# Patient Record
Sex: Female | Born: 1960 | Hispanic: No | Marital: Single | Smoking: Never smoker
Health system: Southern US, Community
[De-identification: ages and names within clinical notes are randomized; demographics above are authoritative.]

## PROBLEM LIST (undated history)

## (undated) DIAGNOSIS — Z780 Asymptomatic menopausal state: Secondary | ICD-10-CM

## (undated) DIAGNOSIS — Z808 Family history of malignant neoplasm of other organs or systems: Secondary | ICD-10-CM

## (undated) DIAGNOSIS — Z800 Family history of malignant neoplasm of digestive organs: Secondary | ICD-10-CM

## (undated) DIAGNOSIS — Z8042 Family history of malignant neoplasm of prostate: Secondary | ICD-10-CM

## (undated) DIAGNOSIS — Z853 Personal history of malignant neoplasm of breast: Secondary | ICD-10-CM

## (undated) DIAGNOSIS — C801 Malignant (primary) neoplasm, unspecified: Secondary | ICD-10-CM

## (undated) DIAGNOSIS — C50919 Malignant neoplasm of unspecified site of unspecified female breast: Principal | ICD-10-CM

## (undated) DIAGNOSIS — Z803 Family history of malignant neoplasm of breast: Secondary | ICD-10-CM

## (undated) DIAGNOSIS — R5383 Other fatigue: Secondary | ICD-10-CM

## (undated) HISTORY — PX: BREAST SURGERY: SHX581

## (undated) HISTORY — DX: Other fatigue: R53.83

## (undated) HISTORY — DX: Personal history of malignant neoplasm of breast: Z85.3

## (undated) HISTORY — DX: Malignant neoplasm of unspecified site of unspecified female breast: C50.919

## (undated) HISTORY — DX: Malignant (primary) neoplasm, unspecified: C80.1

## (undated) HISTORY — DX: Family history of malignant neoplasm of other organs or systems: Z80.8

## (undated) HISTORY — DX: Asymptomatic menopausal state: Z78.0

## (undated) HISTORY — DX: Family history of malignant neoplasm of breast: Z80.3

## (undated) HISTORY — DX: Family history of malignant neoplasm of prostate: Z80.42

## (undated) HISTORY — DX: Family history of malignant neoplasm of digestive organs: Z80.0

---

## 1997-05-15 ENCOUNTER — Ambulatory Visit (HOSPITAL_COMMUNITY): Admission: RE | Admit: 1997-05-15 | Discharge: 1997-05-15 | Payer: Self-pay | Admitting: Obstetrics and Gynecology

## 1998-04-16 ENCOUNTER — Other Ambulatory Visit: Admission: RE | Admit: 1998-04-16 | Discharge: 1998-04-16 | Payer: Self-pay | Admitting: Obstetrics and Gynecology

## 1999-05-24 ENCOUNTER — Other Ambulatory Visit: Admission: RE | Admit: 1999-05-24 | Discharge: 1999-05-24 | Payer: Self-pay | Admitting: *Deleted

## 2001-06-15 ENCOUNTER — Encounter: Payer: Self-pay | Admitting: Unknown Physician Specialty

## 2001-06-15 ENCOUNTER — Ambulatory Visit (HOSPITAL_COMMUNITY): Admission: RE | Admit: 2001-06-15 | Discharge: 2001-06-15 | Payer: Self-pay | Admitting: Unknown Physician Specialty

## 2002-03-21 HISTORY — PX: BREAST BIOPSY: SHX20

## 2002-07-03 ENCOUNTER — Ambulatory Visit (HOSPITAL_COMMUNITY): Admission: RE | Admit: 2002-07-03 | Discharge: 2002-07-03 | Payer: Self-pay | Admitting: Unknown Physician Specialty

## 2002-07-03 ENCOUNTER — Encounter: Payer: Self-pay | Admitting: Unknown Physician Specialty

## 2002-07-10 ENCOUNTER — Ambulatory Visit (HOSPITAL_COMMUNITY): Admission: RE | Admit: 2002-07-10 | Discharge: 2002-07-10 | Payer: Self-pay | Admitting: Unknown Physician Specialty

## 2002-07-10 ENCOUNTER — Encounter: Payer: Self-pay | Admitting: Unknown Physician Specialty

## 2005-03-21 HISTORY — PX: MASTECTOMY: SHX3

## 2006-06-01 ENCOUNTER — Ambulatory Visit (HOSPITAL_COMMUNITY): Admission: RE | Admit: 2006-06-01 | Discharge: 2006-06-01 | Payer: Self-pay | Admitting: Family Medicine

## 2007-03-22 DIAGNOSIS — C50919 Malignant neoplasm of unspecified site of unspecified female breast: Secondary | ICD-10-CM

## 2007-03-22 HISTORY — DX: Malignant neoplasm of unspecified site of unspecified female breast: C50.919

## 2008-02-25 ENCOUNTER — Ambulatory Visit: Payer: Self-pay | Admitting: Genetic Counselor

## 2008-02-29 ENCOUNTER — Encounter: Admission: RE | Admit: 2008-02-29 | Discharge: 2008-02-29 | Payer: Self-pay | Admitting: General Surgery

## 2008-04-09 ENCOUNTER — Ambulatory Visit (HOSPITAL_COMMUNITY): Admission: RE | Admit: 2008-04-09 | Discharge: 2008-04-09 | Payer: Self-pay | Admitting: General Surgery

## 2008-04-09 ENCOUNTER — Encounter (INDEPENDENT_AMBULATORY_CARE_PROVIDER_SITE_OTHER): Payer: Self-pay | Admitting: General Surgery

## 2008-04-09 ENCOUNTER — Encounter: Admission: RE | Admit: 2008-04-09 | Discharge: 2008-04-09 | Payer: Self-pay | Admitting: General Surgery

## 2008-05-06 ENCOUNTER — Ambulatory Visit: Admission: RE | Admit: 2008-05-06 | Discharge: 2008-05-19 | Payer: Self-pay | Admitting: Radiation Oncology

## 2008-05-07 ENCOUNTER — Ambulatory Visit: Payer: Self-pay | Admitting: Oncology

## 2008-05-07 LAB — CBC WITH DIFFERENTIAL/PLATELET
BASO%: 0.3 % (ref 0.0–2.0)
Basophils Absolute: 0 10*3/uL (ref 0.0–0.1)
EOS%: 0.5 % (ref 0.0–7.0)
MCH: 31.8 pg (ref 26.0–34.0)
MCHC: 34.7 g/dL (ref 32.0–36.0)
MCV: 91.4 fL (ref 81.0–101.0)
MONO%: 7.1 % (ref 0.0–13.0)
RBC: 4.04 10*6/uL (ref 3.70–5.32)
RDW: 12.6 % (ref 11.3–14.5)
lymph#: 2.1 10*3/uL (ref 0.9–3.3)

## 2008-05-08 LAB — LACTATE DEHYDROGENASE: LDH: 137 U/L (ref 94–250)

## 2008-05-08 LAB — COMPREHENSIVE METABOLIC PANEL
ALT: 14 U/L (ref 0–35)
BUN: 14 mg/dL (ref 6–23)
CO2: 26 mEq/L (ref 19–32)
Calcium: 9.6 mg/dL (ref 8.4–10.5)
Chloride: 105 mEq/L (ref 96–112)
Creatinine, Ser: 1.01 mg/dL (ref 0.40–1.20)
Glucose, Bld: 74 mg/dL (ref 70–99)

## 2008-05-08 LAB — CANCER ANTIGEN 27.29: CA 27.29: 25 U/mL (ref 0–39)

## 2008-05-28 ENCOUNTER — Encounter: Payer: Self-pay | Admitting: Oncology

## 2008-05-28 ENCOUNTER — Ambulatory Visit: Admission: RE | Admit: 2008-05-28 | Discharge: 2008-05-28 | Payer: Self-pay | Admitting: Oncology

## 2008-05-28 ENCOUNTER — Ambulatory Visit: Payer: Self-pay | Admitting: Cardiology

## 2008-06-12 ENCOUNTER — Inpatient Hospital Stay (HOSPITAL_COMMUNITY): Admission: RE | Admit: 2008-06-12 | Discharge: 2008-06-15 | Payer: Self-pay | Admitting: General Surgery

## 2008-06-12 ENCOUNTER — Encounter (INDEPENDENT_AMBULATORY_CARE_PROVIDER_SITE_OTHER): Payer: Self-pay | Admitting: General Surgery

## 2008-07-02 ENCOUNTER — Ambulatory Visit: Payer: Self-pay | Admitting: Oncology

## 2008-07-18 LAB — CBC WITH DIFFERENTIAL/PLATELET
BASO%: 0.1 % (ref 0.0–2.0)
Basophils Absolute: 0 10*3/uL (ref 0.0–0.1)
HCT: 33.8 % — ABNORMAL LOW (ref 34.8–46.6)
HGB: 11.6 g/dL (ref 11.6–15.9)
LYMPH%: 11.6 % — ABNORMAL LOW (ref 14.0–49.7)
MCH: 31.1 pg (ref 25.1–34.0)
MCHC: 34.3 g/dL (ref 31.5–36.0)
MONO#: 0.2 10*3/uL (ref 0.1–0.9)
NEUT%: 87.3 % — ABNORMAL HIGH (ref 38.4–76.8)
Platelets: 310 10*3/uL (ref 145–400)
WBC: 28.7 10*3/uL — ABNORMAL HIGH (ref 3.9–10.3)
lymph#: 3.3 10*3/uL (ref 0.9–3.3)

## 2008-07-18 LAB — BASIC METABOLIC PANEL
BUN: 20 mg/dL (ref 6–23)
CO2: 23 mEq/L (ref 19–32)
Calcium: 8.7 mg/dL (ref 8.4–10.5)
Chloride: 103 mEq/L (ref 96–112)
Creatinine, Ser: 1.03 mg/dL (ref 0.40–1.20)
Glucose, Bld: 169 mg/dL — ABNORMAL HIGH (ref 70–99)

## 2008-07-25 LAB — CBC WITH DIFFERENTIAL/PLATELET
Basophils Absolute: 0 10*3/uL (ref 0.0–0.1)
Eosinophils Absolute: 0 10*3/uL (ref 0.0–0.5)
HCT: 29.8 % — ABNORMAL LOW (ref 34.8–46.6)
LYMPH%: 21.6 % (ref 14.0–49.7)
MCV: 87.1 fL (ref 79.5–101.0)
MONO#: 0.5 10*3/uL (ref 0.1–0.9)
MONO%: 4.5 % (ref 0.0–14.0)
NEUT#: 7.3 10*3/uL — ABNORMAL HIGH (ref 1.5–6.5)
NEUT%: 73.4 % (ref 38.4–76.8)
Platelets: 227 10*3/uL (ref 145–400)
WBC: 10 10*3/uL (ref 3.9–10.3)
nRBC: 0 % (ref 0–0)

## 2008-08-01 LAB — CBC WITH DIFFERENTIAL/PLATELET
Basophils Absolute: 0 10*3/uL (ref 0.0–0.1)
EOS%: 0 % (ref 0.0–7.0)
HCT: 30.5 % — ABNORMAL LOW (ref 34.8–46.6)
HGB: 10.5 g/dL — ABNORMAL LOW (ref 11.6–15.9)
LYMPH%: 8.3 % — ABNORMAL LOW (ref 14.0–49.7)
MCH: 30.4 pg (ref 25.1–34.0)
MCHC: 34.4 g/dL (ref 31.5–36.0)
MCV: 88.4 fL (ref 79.5–101.0)
NEUT%: 90.6 % — ABNORMAL HIGH (ref 38.4–76.8)
Platelets: 465 10*3/uL — ABNORMAL HIGH (ref 145–400)
lymph#: 1.1 10*3/uL (ref 0.9–3.3)

## 2008-08-01 LAB — COMPREHENSIVE METABOLIC PANEL
Albumin: 4.2 g/dL (ref 3.5–5.2)
CO2: 19 mEq/L (ref 19–32)
Calcium: 9.9 mg/dL (ref 8.4–10.5)
Chloride: 105 mEq/L (ref 96–112)
Glucose, Bld: 202 mg/dL — ABNORMAL HIGH (ref 70–99)
Potassium: 3.9 mEq/L (ref 3.5–5.3)
Sodium: 139 mEq/L (ref 135–145)
Total Bilirubin: 0.6 mg/dL (ref 0.3–1.2)
Total Protein: 6.8 g/dL (ref 6.0–8.3)

## 2008-08-08 LAB — CBC WITH DIFFERENTIAL/PLATELET
BASO%: 0.2 % (ref 0.0–2.0)
Eosinophils Absolute: 0 10*3/uL (ref 0.0–0.5)
MCHC: 34.5 g/dL (ref 31.5–36.0)
MONO#: 1.4 10*3/uL — ABNORMAL HIGH (ref 0.1–0.9)
NEUT#: 9.6 10*3/uL — ABNORMAL HIGH (ref 1.5–6.5)
RBC: 3.54 10*6/uL — ABNORMAL LOW (ref 3.70–5.45)
RDW: 12.8 % (ref 11.2–14.5)
WBC: 13.6 10*3/uL — ABNORMAL HIGH (ref 3.9–10.3)
lymph#: 2.6 10*3/uL (ref 0.9–3.3)

## 2008-08-13 ENCOUNTER — Ambulatory Visit: Payer: Self-pay | Admitting: Oncology

## 2008-08-15 LAB — CBC WITH DIFFERENTIAL/PLATELET
BASO%: 0.2 % (ref 0.0–2.0)
Eosinophils Absolute: 0 10*3/uL (ref 0.0–0.5)
HCT: 29.8 % — ABNORMAL LOW (ref 34.8–46.6)
LYMPH%: 24 % (ref 14.0–49.7)
MONO#: 0.5 10*3/uL (ref 0.1–0.9)
NEUT#: 6.6 10*3/uL — ABNORMAL HIGH (ref 1.5–6.5)
NEUT%: 70.5 % (ref 38.4–76.8)
Platelets: 149 10*3/uL (ref 145–400)
WBC: 9.4 10*3/uL (ref 3.9–10.3)
lymph#: 2.3 10*3/uL (ref 0.9–3.3)

## 2008-08-22 ENCOUNTER — Ambulatory Visit: Payer: Self-pay | Admitting: Oncology

## 2008-08-22 LAB — COMPREHENSIVE METABOLIC PANEL
ALT: 29 U/L (ref 0–35)
AST: 17 U/L (ref 0–37)
Creatinine, Ser: 0.89 mg/dL (ref 0.40–1.20)
Sodium: 137 mEq/L (ref 135–145)
Total Bilirubin: 0.4 mg/dL (ref 0.3–1.2)
Total Protein: 6.9 g/dL (ref 6.0–8.3)

## 2008-08-22 LAB — CBC WITH DIFFERENTIAL/PLATELET
BASO%: 0.1 % (ref 0.0–2.0)
LYMPH%: 8 % — ABNORMAL LOW (ref 14.0–49.7)
MCHC: 34.8 g/dL (ref 31.5–36.0)
MONO#: 0.2 10*3/uL (ref 0.1–0.9)
MONO%: 1.5 % (ref 0.0–14.0)
Platelets: 270 10*3/uL (ref 145–400)
RBC: 3.46 10*6/uL — ABNORMAL LOW (ref 3.70–5.45)
RDW: 14 % (ref 11.2–14.5)
WBC: 13.7 10*3/uL — ABNORMAL HIGH (ref 3.9–10.3)
nRBC: 0 % (ref 0–0)

## 2008-08-29 LAB — CBC WITH DIFFERENTIAL/PLATELET
BASO%: 0.2 % (ref 0.0–2.0)
EOS%: 0 % (ref 0.0–7.0)
MCH: 30.4 pg (ref 25.1–34.0)
MCHC: 34.4 g/dL (ref 31.5–36.0)
MCV: 88.5 fL (ref 79.5–101.0)
MONO%: 13.3 % (ref 0.0–14.0)
RBC: 3.22 10*6/uL — ABNORMAL LOW (ref 3.70–5.45)
RDW: 13.5 % (ref 11.2–14.5)
lymph#: 2.1 10*3/uL (ref 0.9–3.3)

## 2008-09-05 LAB — CBC WITH DIFFERENTIAL/PLATELET
Basophils Absolute: 0 10*3/uL (ref 0.0–0.1)
EOS%: 0 % (ref 0.0–7.0)
Eosinophils Absolute: 0 10*3/uL (ref 0.0–0.5)
HGB: 10.3 g/dL — ABNORMAL LOW (ref 11.6–15.9)
MONO%: 6.4 % (ref 0.0–14.0)
NEUT#: 6.6 10*3/uL — ABNORMAL HIGH (ref 1.5–6.5)
RBC: 3.34 10*6/uL — ABNORMAL LOW (ref 3.70–5.45)
RDW: 14.7 % — ABNORMAL HIGH (ref 11.2–14.5)
lymph#: 2.5 10*3/uL (ref 0.9–3.3)

## 2008-09-12 LAB — CBC WITH DIFFERENTIAL/PLATELET
Basophils Absolute: 0 10*3/uL (ref 0.0–0.1)
Eosinophils Absolute: 0 10*3/uL (ref 0.0–0.5)
HGB: 10.5 g/dL — ABNORMAL LOW (ref 11.6–15.9)
MCV: 88.9 fL (ref 79.5–101.0)
MONO#: 0.1 10*3/uL (ref 0.1–0.9)
MONO%: 0.7 % (ref 0.0–14.0)
NEUT#: 11 10*3/uL — ABNORMAL HIGH (ref 1.5–6.5)
Platelets: 219 10*3/uL (ref 145–400)
RBC: 3.41 10*6/uL — ABNORMAL LOW (ref 3.70–5.45)
RDW: 14.9 % — ABNORMAL HIGH (ref 11.2–14.5)
WBC: 12.3 10*3/uL — ABNORMAL HIGH (ref 3.9–10.3)

## 2008-09-18 LAB — CBC WITH DIFFERENTIAL/PLATELET
BASO%: 0.4 % (ref 0.0–2.0)
EOS%: 0.2 % (ref 0.0–7.0)
MCH: 30.4 pg (ref 25.1–34.0)
MCHC: 34.2 g/dL (ref 31.5–36.0)
RDW: 14.4 % (ref 11.2–14.5)
WBC: 4.9 10*3/uL (ref 3.9–10.3)
lymph#: 1.9 10*3/uL (ref 0.9–3.3)

## 2008-09-19 ENCOUNTER — Ambulatory Visit: Payer: Self-pay | Admitting: Oncology

## 2008-09-26 LAB — COMPREHENSIVE METABOLIC PANEL
ALT: 98 U/L — ABNORMAL HIGH (ref 0–35)
Alkaline Phosphatase: 90 U/L (ref 39–117)
Creatinine, Ser: 0.93 mg/dL (ref 0.40–1.20)
Sodium: 139 mEq/L (ref 135–145)
Total Bilirubin: 0.6 mg/dL (ref 0.3–1.2)
Total Protein: 5.6 g/dL — ABNORMAL LOW (ref 6.0–8.3)

## 2008-09-26 LAB — CBC WITH DIFFERENTIAL/PLATELET
BASO%: 0.1 % (ref 0.0–2.0)
HGB: 9.7 g/dL — ABNORMAL LOW (ref 11.6–15.9)
LYMPH%: 28.6 % (ref 14.0–49.7)
MCH: 30.6 pg (ref 25.1–34.0)
MCHC: 33.8 g/dL (ref 31.5–36.0)
MCV: 90.5 fL (ref 79.5–101.0)
MONO%: 7.8 % (ref 0.0–14.0)
NEUT#: 5.3 10*3/uL (ref 1.5–6.5)
NEUT%: 63.5 % (ref 38.4–76.8)
RBC: 3.17 10*6/uL — ABNORMAL LOW (ref 3.70–5.45)
WBC: 8.3 10*3/uL (ref 3.9–10.3)

## 2008-10-03 LAB — CBC WITH DIFFERENTIAL/PLATELET
Eosinophils Absolute: 0 10*3/uL (ref 0.0–0.5)
HCT: 28.6 % — ABNORMAL LOW (ref 34.8–46.6)
LYMPH%: 11.9 % — ABNORMAL LOW (ref 14.0–49.7)
MCHC: 34.6 g/dL (ref 31.5–36.0)
MCV: 90.2 fL (ref 79.5–101.0)
MONO#: 0.2 10*3/uL (ref 0.1–0.9)
MONO%: 1.9 % (ref 0.0–14.0)
NEUT#: 9.9 10*3/uL — ABNORMAL HIGH (ref 1.5–6.5)
NEUT%: 86.2 % — ABNORMAL HIGH (ref 38.4–76.8)
Platelets: 234 10*3/uL (ref 145–400)
RBC: 3.17 10*6/uL — ABNORMAL LOW (ref 3.70–5.45)
WBC: 11.5 10*3/uL — ABNORMAL HIGH (ref 3.9–10.3)

## 2008-10-03 LAB — COMPREHENSIVE METABOLIC PANEL
ALT: 70 U/L — ABNORMAL HIGH (ref 0–35)
Albumin: 4.4 g/dL (ref 3.5–5.2)
Alkaline Phosphatase: 75 U/L (ref 39–117)
CO2: 22 mEq/L (ref 19–32)
Glucose, Bld: 139 mg/dL — ABNORMAL HIGH (ref 70–99)
Potassium: 3.8 mEq/L (ref 3.5–5.3)
Sodium: 137 mEq/L (ref 135–145)
Total Bilirubin: 0.5 mg/dL (ref 0.3–1.2)
Total Protein: 6.6 g/dL (ref 6.0–8.3)

## 2008-10-07 ENCOUNTER — Encounter: Admission: RE | Admit: 2008-10-07 | Discharge: 2008-10-07 | Payer: Self-pay | Admitting: General Surgery

## 2008-10-10 LAB — CBC WITH DIFFERENTIAL/PLATELET
BASO%: 0.2 % (ref 0.0–2.0)
EOS%: 0 % (ref 0.0–7.0)
Eosinophils Absolute: 0 10*3/uL (ref 0.0–0.5)
LYMPH%: 42.1 % (ref 14.0–49.7)
MCHC: 34.7 g/dL (ref 31.5–36.0)
MCV: 90.1 fL (ref 79.5–101.0)
MONO%: 8.2 % (ref 0.0–14.0)
NEUT#: 2.5 10*3/uL (ref 1.5–6.5)
Platelets: 177 10*3/uL (ref 145–400)
RBC: 2.94 10*6/uL — ABNORMAL LOW (ref 3.70–5.45)
RDW: 15.2 % — ABNORMAL HIGH (ref 11.2–14.5)

## 2008-10-17 LAB — COMPREHENSIVE METABOLIC PANEL
ALT: 71 U/L — ABNORMAL HIGH (ref 0–35)
Albumin: 3.6 g/dL (ref 3.5–5.2)
CO2: 28 mEq/L (ref 19–32)
Chloride: 107 mEq/L (ref 96–112)
Glucose, Bld: 129 mg/dL — ABNORMAL HIGH (ref 70–99)
Potassium: 3.5 mEq/L (ref 3.5–5.3)
Sodium: 140 mEq/L (ref 135–145)
Total Bilirubin: 0.5 mg/dL (ref 0.3–1.2)
Total Protein: 6.2 g/dL (ref 6.0–8.3)

## 2008-10-17 LAB — CBC WITH DIFFERENTIAL/PLATELET
BASO%: 0.2 % (ref 0.0–2.0)
Eosinophils Absolute: 0 10*3/uL (ref 0.0–0.5)
LYMPH%: 32.1 % (ref 14.0–49.7)
MCHC: 34.3 g/dL (ref 31.5–36.0)
MONO#: 0.6 10*3/uL (ref 0.1–0.9)
NEUT#: 4.9 10*3/uL (ref 1.5–6.5)
RBC: 3.27 10*6/uL — ABNORMAL LOW (ref 3.70–5.45)
RDW: 16.2 % — ABNORMAL HIGH (ref 11.2–14.5)
WBC: 8.2 10*3/uL (ref 3.9–10.3)
lymph#: 2.6 10*3/uL (ref 0.9–3.3)

## 2008-10-22 ENCOUNTER — Ambulatory Visit: Payer: Self-pay | Admitting: Oncology

## 2008-10-24 LAB — COMPREHENSIVE METABOLIC PANEL
ALT: 71 U/L — ABNORMAL HIGH (ref 0–35)
AST: 54 U/L — ABNORMAL HIGH (ref 0–37)
Albumin: 4.1 g/dL (ref 3.5–5.2)
Alkaline Phosphatase: 70 U/L (ref 39–117)
BUN: 12 mg/dL (ref 6–23)
Potassium: 3.8 mEq/L (ref 3.5–5.3)
Sodium: 139 mEq/L (ref 135–145)
Total Protein: 6.4 g/dL (ref 6.0–8.3)

## 2008-10-24 LAB — CBC WITH DIFFERENTIAL/PLATELET
Basophils Absolute: 0 10*3/uL (ref 0.0–0.1)
Eosinophils Absolute: 0 10*3/uL (ref 0.0–0.5)
HCT: 29 % — ABNORMAL LOW (ref 34.8–46.6)
HGB: 10 g/dL — ABNORMAL LOW (ref 11.6–15.9)
MONO#: 0.3 10*3/uL (ref 0.1–0.9)
NEUT#: 14.4 10*3/uL — ABNORMAL HIGH (ref 1.5–6.5)
NEUT%: 88.9 % — ABNORMAL HIGH (ref 38.4–76.8)
RDW: 16.6 % — ABNORMAL HIGH (ref 11.2–14.5)
lymph#: 1.4 10*3/uL (ref 0.9–3.3)

## 2008-10-31 LAB — CBC WITH DIFFERENTIAL/PLATELET
Basophils Absolute: 0 10*3/uL (ref 0.0–0.1)
EOS%: 0.1 % (ref 0.0–7.0)
HGB: 9.5 g/dL — ABNORMAL LOW (ref 11.6–15.9)
MCH: 31.8 pg (ref 25.1–34.0)
MCHC: 33.7 g/dL (ref 31.5–36.0)
MCV: 94.3 fL (ref 79.5–101.0)
MONO%: 13.9 % (ref 0.0–14.0)
NEUT%: 57 % (ref 38.4–76.8)
RDW: 14.9 % — ABNORMAL HIGH (ref 11.2–14.5)

## 2008-10-31 LAB — COMPREHENSIVE METABOLIC PANEL
Alkaline Phosphatase: 90 U/L (ref 39–117)
BUN: 6 mg/dL (ref 6–23)
Glucose, Bld: 81 mg/dL (ref 70–99)
Sodium: 140 mEq/L (ref 135–145)
Total Bilirubin: 0.7 mg/dL (ref 0.3–1.2)

## 2008-11-12 LAB — CBC WITH DIFFERENTIAL/PLATELET
Basophils Absolute: 0 10*3/uL (ref 0.0–0.1)
Eosinophils Absolute: 0 10*3/uL (ref 0.0–0.5)
HGB: 10.2 g/dL — ABNORMAL LOW (ref 11.6–15.9)
LYMPH%: 33.2 % (ref 14.0–49.7)
MCV: 95.3 fL (ref 79.5–101.0)
MONO%: 10.1 % (ref 0.0–14.0)
NEUT#: 3.2 10*3/uL (ref 1.5–6.5)
Platelets: 185 10*3/uL (ref 145–400)

## 2008-11-12 LAB — COMPREHENSIVE METABOLIC PANEL
Albumin: 3.7 g/dL (ref 3.5–5.2)
Alkaline Phosphatase: 69 U/L (ref 39–117)
BUN: 19 mg/dL (ref 6–23)
Glucose, Bld: 91 mg/dL (ref 70–99)
Total Bilirubin: 0.6 mg/dL (ref 0.3–1.2)

## 2008-11-12 LAB — CANCER ANTIGEN 27.29: CA 27.29: 37 U/mL (ref 0–39)

## 2008-11-28 ENCOUNTER — Encounter: Payer: Self-pay | Admitting: Cardiology

## 2008-11-28 ENCOUNTER — Encounter: Payer: Self-pay | Admitting: Oncology

## 2008-11-28 ENCOUNTER — Ambulatory Visit: Payer: Self-pay

## 2008-12-03 ENCOUNTER — Ambulatory Visit: Payer: Self-pay | Admitting: Oncology

## 2008-12-05 LAB — CBC WITH DIFFERENTIAL/PLATELET
BASO%: 0.2 % (ref 0.0–2.0)
Basophils Absolute: 0 10*3/uL (ref 0.0–0.1)
EOS%: 1 % (ref 0.0–7.0)
HCT: 31.5 % — ABNORMAL LOW (ref 34.8–46.6)
HGB: 10.9 g/dL — ABNORMAL LOW (ref 11.6–15.9)
LYMPH%: 43 % (ref 14.0–49.7)
MCH: 32.2 pg (ref 25.1–34.0)
MCHC: 34.6 g/dL (ref 31.5–36.0)
NEUT%: 48.9 % (ref 38.4–76.8)
Platelets: 232 10*3/uL (ref 145–400)
lymph#: 2.3 10*3/uL (ref 0.9–3.3)

## 2008-12-05 LAB — COMPREHENSIVE METABOLIC PANEL
AST: 27 U/L (ref 0–37)
Albumin: 3.7 g/dL (ref 3.5–5.2)
BUN: 17 mg/dL (ref 6–23)
Calcium: 9.1 mg/dL (ref 8.4–10.5)
Chloride: 107 mEq/L (ref 96–112)
Creatinine, Ser: 0.84 mg/dL (ref 0.40–1.20)
Glucose, Bld: 108 mg/dL — ABNORMAL HIGH (ref 70–99)
Potassium: 3.7 mEq/L (ref 3.5–5.3)

## 2008-12-26 LAB — CBC WITH DIFFERENTIAL/PLATELET
BASO%: 0.4 % (ref 0.0–2.0)
Basophils Absolute: 0 10*3/uL (ref 0.0–0.1)
Eosinophils Absolute: 0 10*3/uL (ref 0.0–0.5)
HCT: 33 % — ABNORMAL LOW (ref 34.8–46.6)
HGB: 11.5 g/dL — ABNORMAL LOW (ref 11.6–15.9)
LYMPH%: 46.8 % (ref 14.0–49.7)
MONO#: 0.4 10*3/uL (ref 0.1–0.9)
NEUT#: 2.2 10*3/uL (ref 1.5–6.5)
NEUT%: 44.5 % (ref 38.4–76.8)
Platelets: 227 10*3/uL (ref 145–400)
WBC: 4.8 10*3/uL (ref 3.9–10.3)
lymph#: 2.3 10*3/uL (ref 0.9–3.3)

## 2009-01-01 ENCOUNTER — Ambulatory Visit: Payer: Self-pay | Admitting: Psychiatry

## 2009-01-14 ENCOUNTER — Ambulatory Visit: Payer: Self-pay | Admitting: Oncology

## 2009-01-19 LAB — COMPREHENSIVE METABOLIC PANEL
Albumin: 4.1 g/dL (ref 3.5–5.2)
Alkaline Phosphatase: 79 U/L (ref 39–117)
BUN: 15 mg/dL (ref 6–23)
CO2: 28 mEq/L (ref 19–32)
Glucose, Bld: 100 mg/dL — ABNORMAL HIGH (ref 70–99)
Potassium: 3.6 mEq/L (ref 3.5–5.3)
Sodium: 139 mEq/L (ref 135–145)
Total Bilirubin: 0.8 mg/dL (ref 0.3–1.2)
Total Protein: 6.6 g/dL (ref 6.0–8.3)

## 2009-01-19 LAB — LACTATE DEHYDROGENASE: LDH: 156 U/L (ref 94–250)

## 2009-01-19 LAB — CBC WITH DIFFERENTIAL/PLATELET
Basophils Absolute: 0 10*3/uL (ref 0.0–0.1)
Eosinophils Absolute: 0 10*3/uL (ref 0.0–0.5)
HCT: 36.1 % (ref 34.8–46.6)
HGB: 12.8 g/dL (ref 11.6–15.9)
LYMPH%: 44.5 % (ref 14.0–49.7)
MCV: 91.2 fL (ref 79.5–101.0)
MONO#: 0.4 10*3/uL (ref 0.1–0.9)
MONO%: 6.5 % (ref 0.0–14.0)
NEUT#: 2.8 10*3/uL (ref 1.5–6.5)
Platelets: 262 10*3/uL (ref 145–400)
WBC: 5.9 10*3/uL (ref 3.9–10.3)

## 2009-01-19 LAB — CANCER ANTIGEN 27.29: CA 27.29: 24 U/mL (ref 0–39)

## 2009-02-09 LAB — CBC WITH DIFFERENTIAL/PLATELET
BASO%: 0.2 % (ref 0.0–2.0)
EOS%: 0.8 % (ref 0.0–7.0)
HCT: 35.8 % (ref 34.8–46.6)
MCH: 31.6 pg (ref 25.1–34.0)
MCHC: 34.9 g/dL (ref 31.5–36.0)
MONO#: 0.4 10*3/uL (ref 0.1–0.9)
RDW: 11.7 % (ref 11.2–14.5)
WBC: 6.2 10*3/uL (ref 3.9–10.3)
lymph#: 2.6 10*3/uL (ref 0.9–3.3)

## 2009-02-09 LAB — COMPREHENSIVE METABOLIC PANEL
ALT: 30 U/L (ref 0–35)
BUN: 12 mg/dL (ref 6–23)
CO2: 29 mEq/L (ref 19–32)
Calcium: 9.2 mg/dL (ref 8.4–10.5)
Chloride: 106 mEq/L (ref 96–112)
Creatinine, Ser: 0.75 mg/dL (ref 0.40–1.20)
Glucose, Bld: 102 mg/dL — ABNORMAL HIGH (ref 70–99)
Total Bilirubin: 0.6 mg/dL (ref 0.3–1.2)

## 2009-02-24 ENCOUNTER — Ambulatory Visit: Payer: Self-pay | Admitting: Internal Medicine

## 2009-02-24 ENCOUNTER — Encounter: Payer: Self-pay | Admitting: Oncology

## 2009-02-24 ENCOUNTER — Ambulatory Visit: Payer: Self-pay

## 2009-02-24 ENCOUNTER — Ambulatory Visit (HOSPITAL_COMMUNITY): Admission: RE | Admit: 2009-02-24 | Discharge: 2009-02-24 | Payer: Self-pay | Admitting: Oncology

## 2009-02-25 ENCOUNTER — Encounter: Payer: Self-pay | Admitting: Internal Medicine

## 2009-02-26 ENCOUNTER — Ambulatory Visit: Payer: Self-pay | Admitting: Oncology

## 2009-03-02 LAB — CBC WITH DIFFERENTIAL/PLATELET
Basophils Absolute: 0 10*3/uL (ref 0.0–0.1)
Eosinophils Absolute: 0 10*3/uL (ref 0.0–0.5)
HGB: 12.8 g/dL (ref 11.6–15.9)
MCV: 90.3 fL (ref 79.5–101.0)
MONO#: 0.2 10*3/uL (ref 0.1–0.9)
NEUT#: 2.8 10*3/uL (ref 1.5–6.5)
RBC: 4.04 10*6/uL (ref 3.70–5.45)
RDW: 12.1 % (ref 11.2–14.5)
WBC: 5.1 10*3/uL (ref 3.9–10.3)
lymph#: 2.1 10*3/uL (ref 0.9–3.3)

## 2009-03-02 LAB — COMPREHENSIVE METABOLIC PANEL
Albumin: 4.1 g/dL (ref 3.5–5.2)
Alkaline Phosphatase: 77 U/L (ref 39–117)
BUN: 13 mg/dL (ref 6–23)
CO2: 27 mEq/L (ref 19–32)
Calcium: 9.4 mg/dL (ref 8.4–10.5)
Glucose, Bld: 140 mg/dL — ABNORMAL HIGH (ref 70–99)
Potassium: 3.4 mEq/L — ABNORMAL LOW (ref 3.5–5.3)

## 2009-03-03 ENCOUNTER — Ambulatory Visit (HOSPITAL_COMMUNITY): Admission: RE | Admit: 2009-03-03 | Discharge: 2009-03-03 | Payer: Self-pay | Admitting: Obstetrics & Gynecology

## 2009-03-23 LAB — CBC WITH DIFFERENTIAL/PLATELET
Eosinophils Absolute: 0 10*3/uL (ref 0.0–0.5)
HCT: 34.7 % — ABNORMAL LOW (ref 34.8–46.6)
LYMPH%: 36.2 % (ref 14.0–49.7)
MONO#: 0.3 10*3/uL (ref 0.1–0.9)
NEUT#: 3.6 10*3/uL (ref 1.5–6.5)
Platelets: 309 10*3/uL (ref 145–400)
RBC: 3.81 10*6/uL (ref 3.70–5.45)
WBC: 6.2 10*3/uL (ref 3.9–10.3)
lymph#: 2.2 10*3/uL (ref 0.9–3.3)

## 2009-03-23 LAB — COMPREHENSIVE METABOLIC PANEL
CO2: 28 mEq/L (ref 19–32)
Creatinine, Ser: 0.96 mg/dL (ref 0.40–1.20)
Glucose, Bld: 96 mg/dL (ref 70–99)
Total Bilirubin: 0.8 mg/dL (ref 0.3–1.2)
Total Protein: 6.5 g/dL (ref 6.0–8.3)

## 2009-04-10 ENCOUNTER — Ambulatory Visit: Payer: Self-pay | Admitting: Oncology

## 2009-04-14 LAB — COMPREHENSIVE METABOLIC PANEL
Albumin: 3.9 g/dL (ref 3.5–5.2)
CO2: 27 mEq/L (ref 19–32)
Calcium: 9.1 mg/dL (ref 8.4–10.5)
Chloride: 104 mEq/L (ref 96–112)
Glucose, Bld: 99 mg/dL (ref 70–99)
Sodium: 138 mEq/L (ref 135–145)
Total Bilirubin: 0.5 mg/dL (ref 0.3–1.2)
Total Protein: 6.5 g/dL (ref 6.0–8.3)

## 2009-04-14 LAB — CBC WITH DIFFERENTIAL/PLATELET
BASO%: 0.3 % (ref 0.0–2.0)
Eosinophils Absolute: 0 10*3/uL (ref 0.0–0.5)
MONO#: 0.4 10*3/uL (ref 0.1–0.9)
NEUT#: 3.9 10*3/uL (ref 1.5–6.5)
Platelets: 335 10*3/uL (ref 145–400)
RBC: 3.74 10*6/uL (ref 3.70–5.45)
RDW: 13.2 % (ref 11.2–14.5)
WBC: 6.4 10*3/uL (ref 3.9–10.3)
lymph#: 2.2 10*3/uL (ref 0.9–3.3)

## 2009-05-05 LAB — CBC WITH DIFFERENTIAL/PLATELET
Basophils Absolute: 0 10*3/uL (ref 0.0–0.1)
HCT: 35.7 % (ref 34.8–46.6)
HGB: 12.4 g/dL (ref 11.6–15.9)
MONO#: 0.3 10*3/uL (ref 0.1–0.9)
NEUT%: 55.7 % (ref 38.4–76.8)
Platelets: 285 10*3/uL (ref 145–400)
WBC: 5.2 10*3/uL (ref 3.9–10.3)
lymph#: 1.9 10*3/uL (ref 0.9–3.3)

## 2009-05-05 LAB — COMPREHENSIVE METABOLIC PANEL
BUN: 11 mg/dL (ref 6–23)
CO2: 28 mEq/L (ref 19–32)
Calcium: 9.3 mg/dL (ref 8.4–10.5)
Chloride: 105 mEq/L (ref 96–112)
Creatinine, Ser: 1.03 mg/dL (ref 0.40–1.20)
Glucose, Bld: 98 mg/dL (ref 70–99)

## 2009-05-18 IMAGING — CR DG CHEST 1V PORT
1 series · 1 of 1 positions shown · non-contrast
Comparison: 04/08/2008.

CLINICAL DATA: 47-year-old female with breast cancer.  Port-A-Cath
placement.

PORTABLE CHEST - 1 VIEW

[view not recorded]
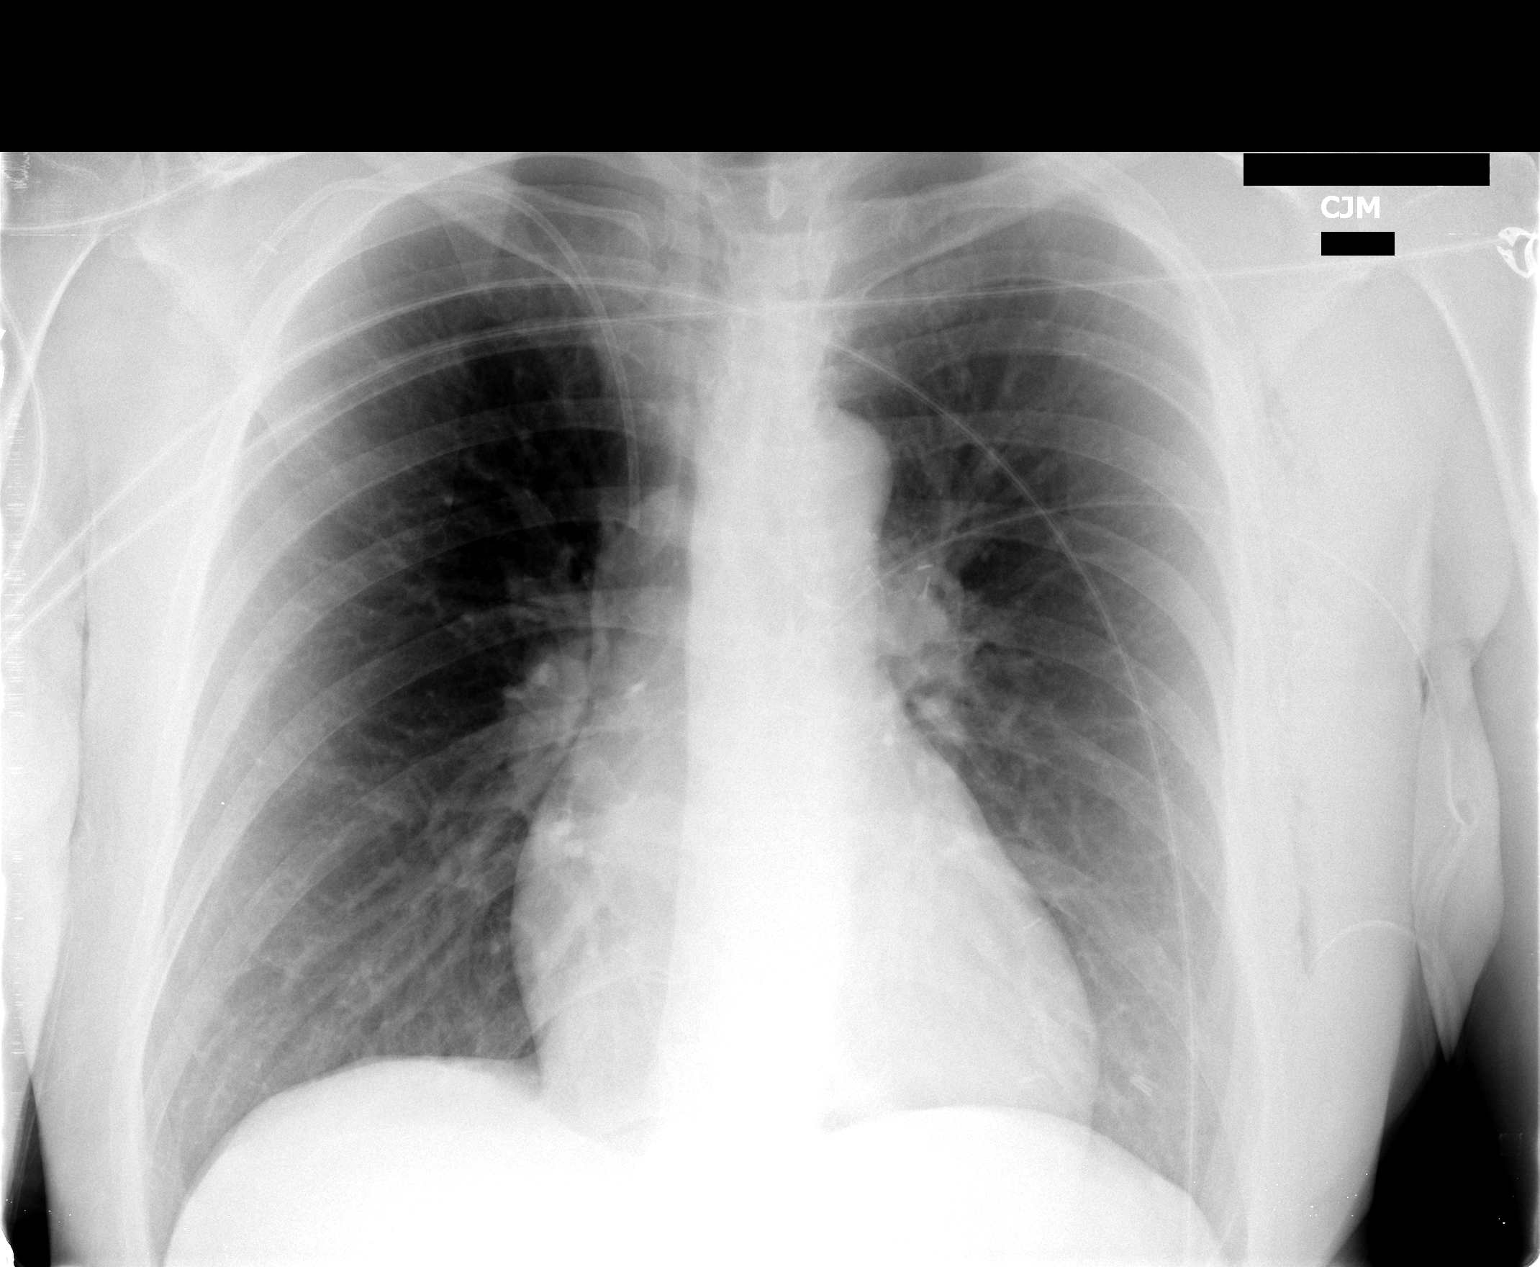

[1 of 1 positions shown; findings below may reference images not displayed]

FINDINGS: Portable semi upright AP view 3481 hours.  Stable lung
volumes.  Cardiac size and mediastinal contours are within normal
limits.  No pneumothorax, pulmonary edema, pleural effusion or
acute airspace disease.  Multiple small surgical clips project over
the left lung, likely associated with the left breast.  Right
subclavian approach chest Port-A-Cath is in place with largely
radiolucent reservoir.  Catheter tip projects at the level of the
upper superior vena cava.
IMPRESSION: 1.  Right chest Port-A-Cath with subclavian approach catheter, tip
at the level of the upper SVC.
2. No acute cardiopulmonary abnormality.  Surgical clips project
over the left lung.

## 2009-05-22 ENCOUNTER — Ambulatory Visit: Payer: Self-pay | Admitting: Oncology

## 2009-05-26 LAB — CBC WITH DIFFERENTIAL/PLATELET
BASO%: 0.2 % (ref 0.0–2.0)
Eosinophils Absolute: 0 10*3/uL (ref 0.0–0.5)
HCT: 36.5 % (ref 34.8–46.6)
MCHC: 34.8 g/dL (ref 31.5–36.0)
MONO#: 0.4 10*3/uL (ref 0.1–0.9)
NEUT#: 2.6 10*3/uL (ref 1.5–6.5)
NEUT%: 48.5 % (ref 38.4–76.8)
RBC: 3.99 10*6/uL (ref 3.70–5.45)
WBC: 5.3 10*3/uL (ref 3.9–10.3)
lymph#: 2.3 10*3/uL (ref 0.9–3.3)

## 2009-05-26 LAB — COMPREHENSIVE METABOLIC PANEL
Alkaline Phosphatase: 104 U/L (ref 39–117)
BUN: 18 mg/dL (ref 6–23)
CO2: 27 mEq/L (ref 19–32)
Creatinine, Ser: 1.07 mg/dL (ref 0.40–1.20)
Glucose, Bld: 91 mg/dL (ref 70–99)
Sodium: 136 mEq/L (ref 135–145)
Total Bilirubin: 0.4 mg/dL (ref 0.3–1.2)
Total Protein: 6.8 g/dL (ref 6.0–8.3)

## 2009-05-26 LAB — LACTATE DEHYDROGENASE: LDH: 150 U/L (ref 94–250)

## 2009-06-18 ENCOUNTER — Ambulatory Visit: Payer: Self-pay | Admitting: Cardiology

## 2009-06-18 ENCOUNTER — Encounter: Payer: Self-pay | Admitting: Oncology

## 2009-06-18 ENCOUNTER — Ambulatory Visit (HOSPITAL_COMMUNITY): Admission: RE | Admit: 2009-06-18 | Discharge: 2009-06-18 | Payer: Self-pay | Admitting: Oncology

## 2009-06-18 ENCOUNTER — Ambulatory Visit: Payer: Self-pay

## 2009-07-03 ENCOUNTER — Ambulatory Visit: Payer: Self-pay | Admitting: Oncology

## 2009-07-06 ENCOUNTER — Encounter: Admission: RE | Admit: 2009-07-06 | Discharge: 2009-07-27 | Payer: Self-pay | Admitting: Oncology

## 2009-07-07 LAB — CBC WITH DIFFERENTIAL/PLATELET
Basophils Absolute: 0 10*3/uL (ref 0.0–0.1)
Eosinophils Absolute: 0 10*3/uL (ref 0.0–0.5)
HCT: 34.9 % (ref 34.8–46.6)
HGB: 12.2 g/dL (ref 11.6–15.9)
LYMPH%: 39.6 % (ref 14.0–49.7)
MCV: 90.6 fL (ref 79.5–101.0)
MONO#: 0.4 10*3/uL (ref 0.1–0.9)
MONO%: 6.1 % (ref 0.0–14.0)
NEUT#: 3.3 10*3/uL (ref 1.5–6.5)
NEUT%: 53.8 % (ref 38.4–76.8)
Platelets: 296 10*3/uL (ref 145–400)
WBC: 6.2 10*3/uL (ref 3.9–10.3)

## 2009-07-08 LAB — COMPREHENSIVE METABOLIC PANEL
AST: 22 U/L (ref 0–37)
Albumin: 4.3 g/dL (ref 3.5–5.2)
Alkaline Phosphatase: 99 U/L (ref 39–117)
BUN: 15 mg/dL (ref 6–23)
Glucose, Bld: 83 mg/dL (ref 70–99)
Potassium: 4.1 mEq/L (ref 3.5–5.3)
Total Bilirubin: 0.6 mg/dL (ref 0.3–1.2)

## 2009-07-20 ENCOUNTER — Ambulatory Visit (HOSPITAL_COMMUNITY): Admission: RE | Admit: 2009-07-20 | Discharge: 2009-07-20 | Payer: Self-pay | Admitting: General Surgery

## 2009-10-02 ENCOUNTER — Ambulatory Visit: Payer: Self-pay | Admitting: Oncology

## 2009-10-06 LAB — CBC WITH DIFFERENTIAL/PLATELET
Eosinophils Absolute: 0 10*3/uL (ref 0.0–0.5)
HCT: 38.1 % (ref 34.8–46.6)
LYMPH%: 35.2 % (ref 14.0–49.7)
MONO#: 0.4 10*3/uL (ref 0.1–0.9)
NEUT#: 2.7 10*3/uL (ref 1.5–6.5)
NEUT%: 56.3 % (ref 38.4–76.8)
Platelets: 349 10*3/uL (ref 145–400)
WBC: 4.7 10*3/uL (ref 3.9–10.3)
lymph#: 1.7 10*3/uL (ref 0.9–3.3)

## 2009-10-06 LAB — COMPREHENSIVE METABOLIC PANEL
ALT: 39 U/L — ABNORMAL HIGH (ref 0–35)
CO2: 26 mEq/L (ref 19–32)
Calcium: 9.6 mg/dL (ref 8.4–10.5)
Chloride: 105 mEq/L (ref 96–112)
Creatinine, Ser: 0.97 mg/dL (ref 0.40–1.20)
Glucose, Bld: 71 mg/dL (ref 70–99)
Sodium: 141 mEq/L (ref 135–145)
Total Bilirubin: 0.8 mg/dL (ref 0.3–1.2)
Total Protein: 6.8 g/dL (ref 6.0–8.3)

## 2009-10-06 LAB — CANCER ANTIGEN 27.29: CA 27.29: 19 U/mL (ref 0–39)

## 2009-10-06 LAB — LACTATE DEHYDROGENASE: LDH: 180 U/L (ref 94–250)

## 2009-10-08 ENCOUNTER — Encounter: Admission: RE | Admit: 2009-10-08 | Discharge: 2009-10-08 | Payer: Self-pay | Admitting: General Surgery

## 2010-01-29 ENCOUNTER — Ambulatory Visit: Payer: Self-pay | Admitting: Oncology

## 2010-02-02 LAB — CBC WITH DIFFERENTIAL/PLATELET
BASO%: 0.3 % (ref 0.0–2.0)
Basophils Absolute: 0 10*3/uL (ref 0.0–0.1)
Eosinophils Absolute: 0.4 10*3/uL (ref 0.0–0.5)
HCT: 37.7 % (ref 34.8–46.6)
HGB: 12.7 g/dL (ref 11.6–15.9)
MCHC: 33.7 g/dL (ref 31.5–36.0)
MONO#: 0.4 10*3/uL (ref 0.1–0.9)
NEUT#: 2.8 10*3/uL (ref 1.5–6.5)
NEUT%: 44.5 % (ref 38.4–76.8)
WBC: 6.3 10*3/uL (ref 3.9–10.3)
lymph#: 2.7 10*3/uL (ref 0.9–3.3)

## 2010-02-02 LAB — COMPREHENSIVE METABOLIC PANEL
ALT: 33 U/L (ref 0–35)
CO2: 27 mEq/L (ref 19–32)
Calcium: 9.4 mg/dL (ref 8.4–10.5)
Chloride: 107 mEq/L (ref 96–112)
Creatinine, Ser: 0.92 mg/dL (ref 0.40–1.20)

## 2010-02-02 LAB — LACTATE DEHYDROGENASE: LDH: 221 U/L (ref 94–250)

## 2010-02-06 IMAGING — US US TRANSVAGINAL NON-OB
1 series · 14 of 25 positions shown · non-contrast
Comparison: None

CLINICAL DATA: Abnormal periods

TRANSABDOMINAL AND TRANSVAGINAL ULTRASOUND OF PELVIS
TECHNIQUE: Both transabdominal and transvaginal ultrasound
examinations of the pelvis were performed including evaluation of
the uterus, ovaries, adnexal regions, and pelvic cul-de-sac.

[Series 1: us transvaginal non-ob · 0.33mm/px · 14 of 46 slices shown]
[im 1/46]
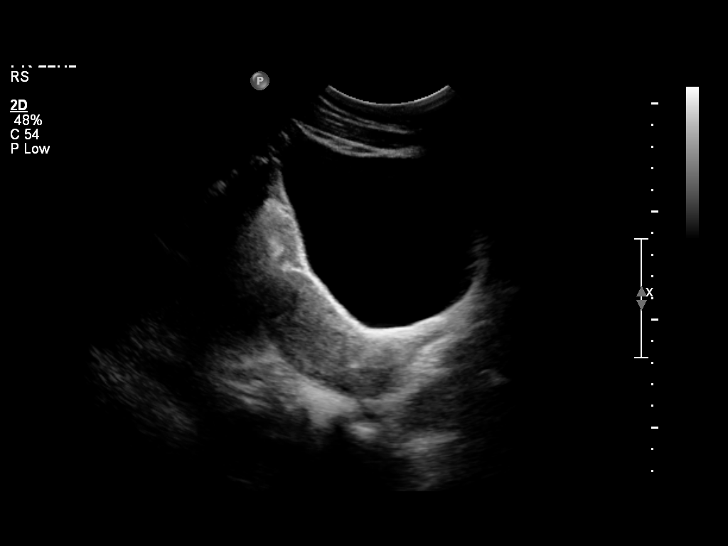
[im 4/46]
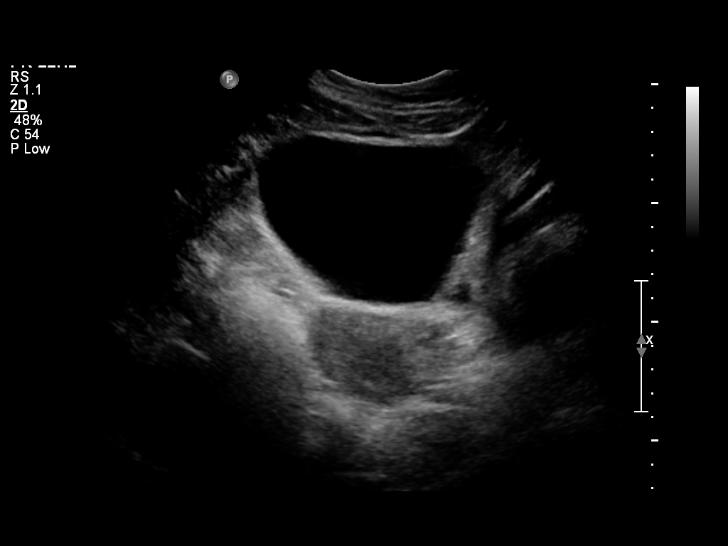
[im 8/46]
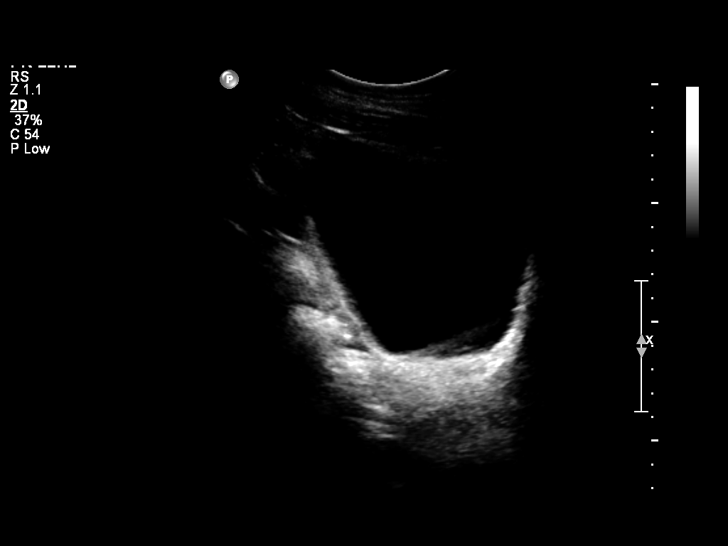
[im 12/46]
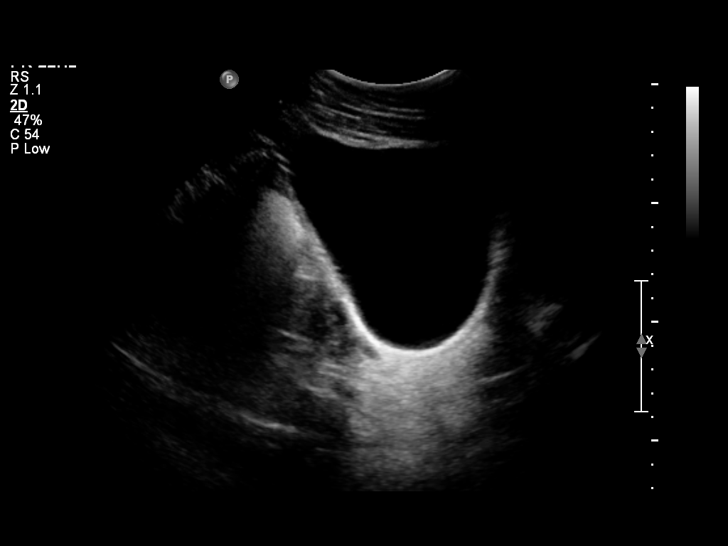
[im 16/46]
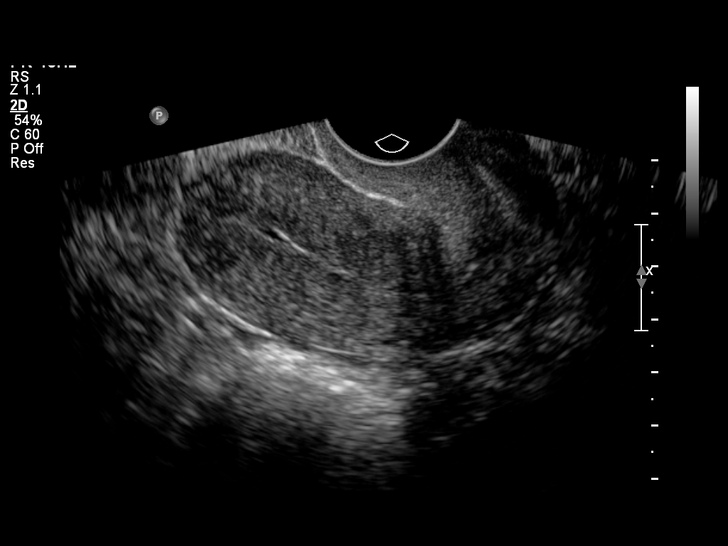
[im 17/46]
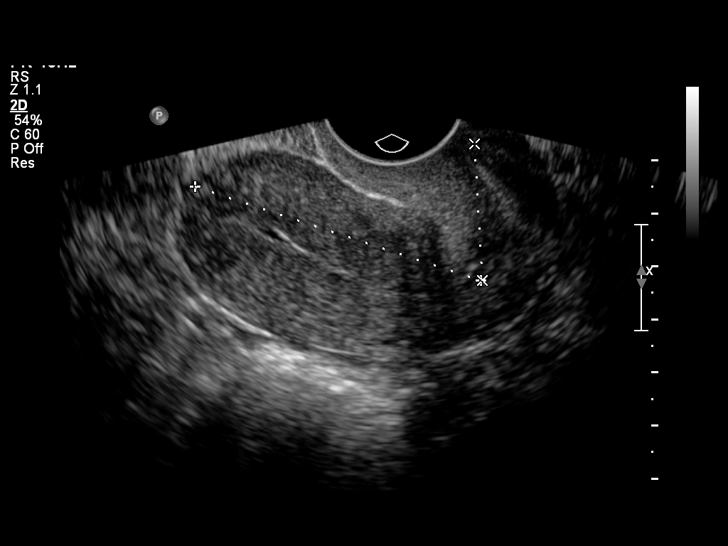
[im 21/46]
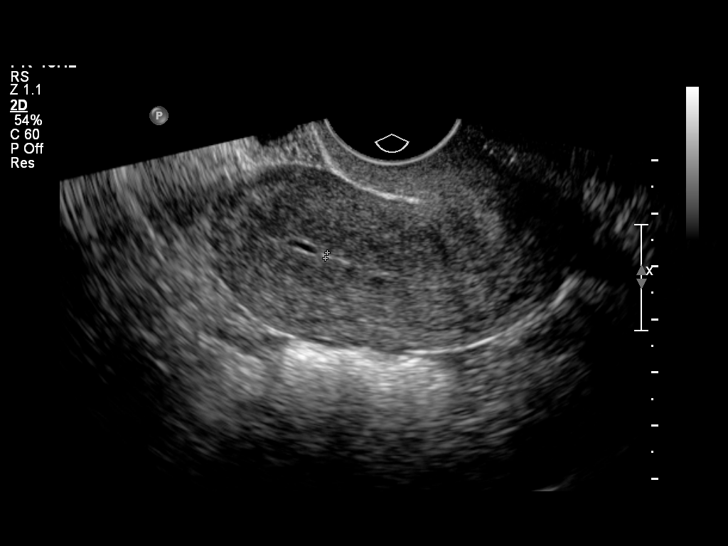
[im 25/46]
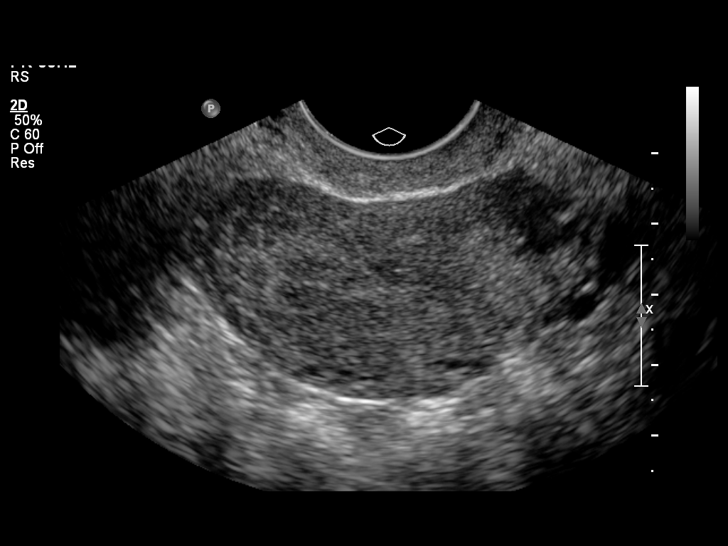
[im 29/46]
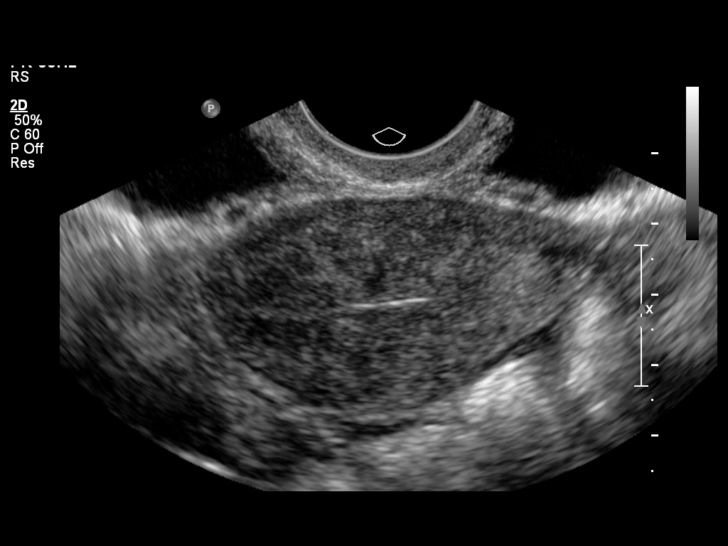
[im 31/46]
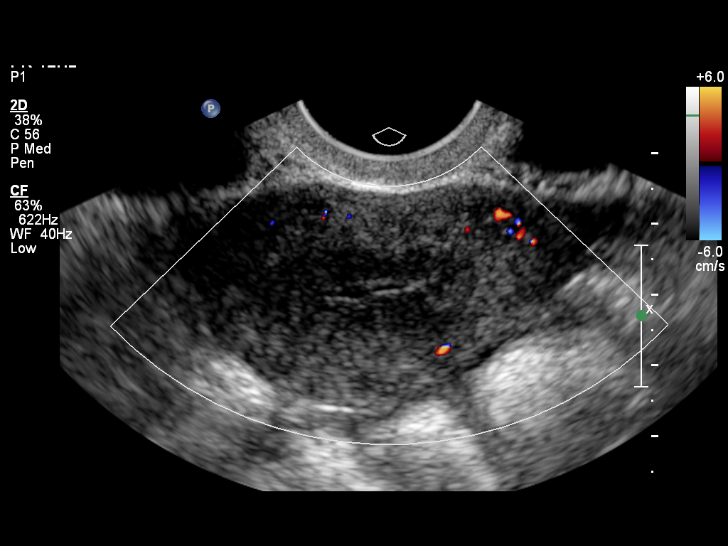
[im 34/46]
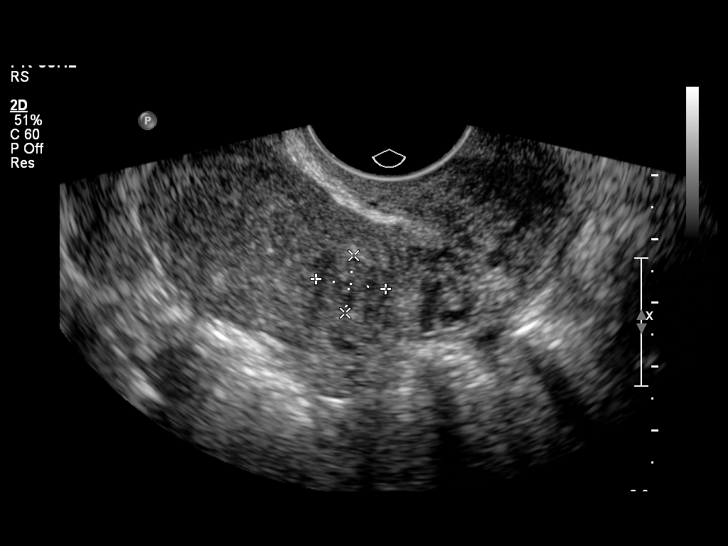
[im 38/46]
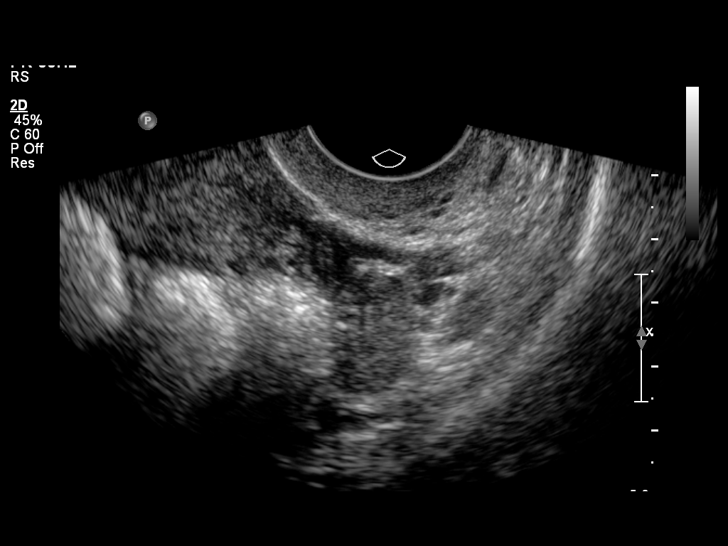
[im 42/46]
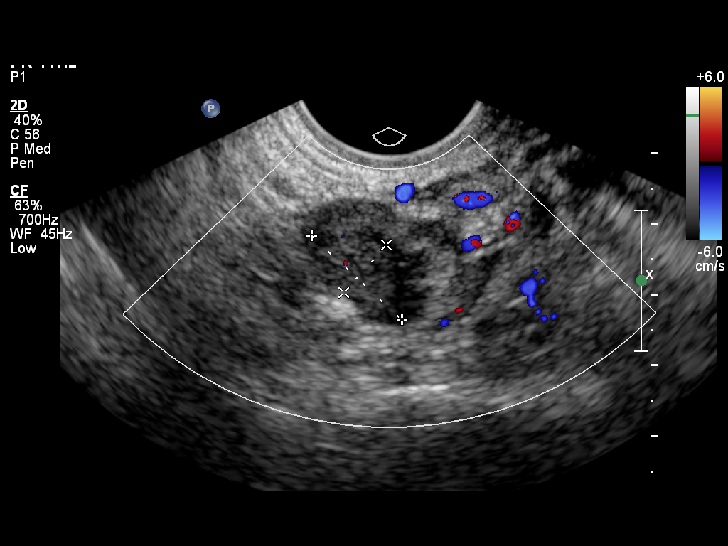
[im 46/46]
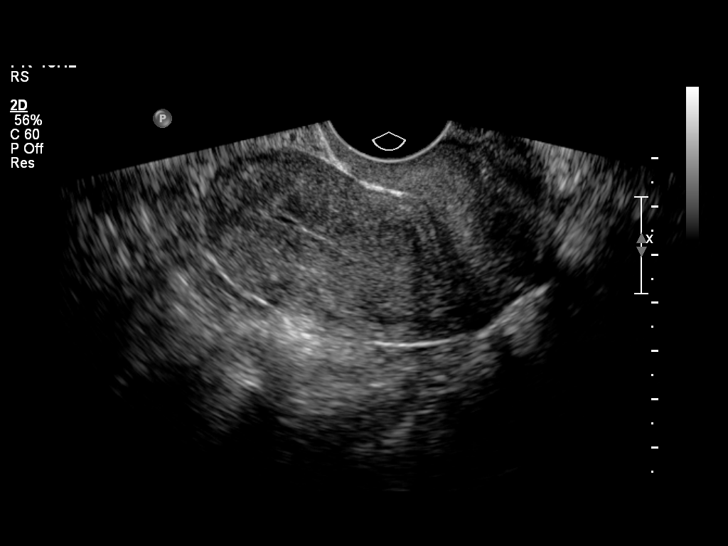

[14 of 25 positions shown; findings below may reference images not displayed]

FINDINGS: The uterus is normal in size, measuring 8.1 x 3.2 x
cm.  There is a 1 cm round area within the myometrium to the right
of midline which is either a myometrial fibroid or focal
adenomyosis.  The endometrial stripe is thin, measuring 1 mm in
width.

Both ovaries have a normal size and appearance.  The right ovary
measures 1.7 x 0.9 x 1.2 cm, and the left ovary measures 1.8 x
x 1.2 cm.  There are no adnexal masses or free pelvic fluid.
IMPRESSION: Small focal fibroid versus focal adenomyosis at the right mid
myometrium.

The exam is otherwise unremarkable.

## 2010-06-08 LAB — CBC
MCHC: 35.8 g/dL (ref 30.0–36.0)
MCV: 94.8 fL (ref 78.0–100.0)
Platelets: 341 10*3/uL (ref 150–400)
WBC: 6.6 10*3/uL (ref 4.0–10.5)

## 2010-06-08 LAB — PROTIME-INR: Prothrombin Time: 12.6 seconds (ref 11.6–15.2)

## 2010-07-01 LAB — CBC
MCHC: 34.1 g/dL (ref 30.0–36.0)
MCHC: 35 g/dL (ref 30.0–36.0)
RBC: 3.37 MIL/uL — ABNORMAL LOW (ref 3.87–5.11)
RBC: 4.2 MIL/uL (ref 3.87–5.11)
WBC: 10.5 10*3/uL (ref 4.0–10.5)

## 2010-07-01 LAB — TYPE AND SCREEN
ABO/RH(D): A NEG
Antibody Screen: NEGATIVE

## 2010-07-01 LAB — BASIC METABOLIC PANEL
CO2: 26 mEq/L (ref 19–32)
Calcium: 7.9 mg/dL — ABNORMAL LOW (ref 8.4–10.5)
Creatinine, Ser: 0.83 mg/dL (ref 0.4–1.2)
GFR calc Af Amer: >60 mL/min (ref >60)

## 2010-07-01 LAB — ABO/RH: ABO/RH(D): A NEG

## 2010-07-05 LAB — DIFFERENTIAL
Basophils Absolute: 0 10*3/uL (ref 0.0–0.1)
Eosinophils Absolute: 0 10*3/uL (ref 0.0–0.7)
Eosinophils Relative: 1 % (ref 0–5)

## 2010-07-05 LAB — COMPREHENSIVE METABOLIC PANEL
ALT: 19 U/L (ref 0–35)
AST: 29 U/L (ref 0–37)
CO2: 25 mEq/L (ref 19–32)
Chloride: 106 mEq/L (ref 96–112)
Creatinine, Ser: 0.85 mg/dL (ref 0.4–1.2)
GFR calc Af Amer: >60 mL/min (ref >60)
GFR calc non Af Amer: >60 mL/min (ref >60)
Sodium: 139 mEq/L (ref 135–145)
Total Bilirubin: 0.7 mg/dL (ref 0.3–1.2)

## 2010-07-05 LAB — CBC
MCV: 93.7 fL (ref 78.0–100.0)
RBC: 4.23 MIL/uL (ref 3.87–5.11)
WBC: 8.9 10*3/uL (ref 4.0–10.5)

## 2010-07-05 LAB — HCG, SERUM, QUALITATIVE: Preg, Serum: NEGATIVE

## 2010-08-03 NOTE — Discharge Summary (Signed)
NAMETIFFINE, HENIGAN NO.:  [id number]   MEDICAL RECORD NO.:  [id number]          PATIENT TYPE:  INP   LOCATION:  5152                         FACILITY:  [facility]   PHYSICIAN:  Loreta Ave, MD DATE OF BIRTH:  1960-07-09   DATE OF ADMISSION:  06/12/2008  DATE OF DISCHARGE:  06/15/2008                               DISCHARGE SUMMARY   ADMISSION DIAGNOSIS:  Left breast cancer.   DISCHARGE DIAGNOSIS:  Left breast cancer.   CONSULTING PHYSICIANS:  Ollen Gross. Vernell Morgans, M.D.   PROCEDURE PERFORMED:  On June 12, 2008.  Nikolina underwent a left  mastectomy and a left muscle-sparing free TRAM flap breast  reconstruction.   INPATIENT MORBIDITY:  None.   CONDITION ON DISCHARGE:  Good.   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  She presents at this time for left mastectomy and  free TRAM flap breast reconstruction.  On June 12, 2008, the patient  was taken to the operating room where Dr. Carolynne Edouard performed a left  mastectomy and Dr. Noelle Penner performed a muscle-sparing free TRAM flap  breast reconstruction.  The patient tolerated the procedure well, was  extubated and transferred to the floor in stable condition.  She was  progressed per free flap protocol with Doppler checks starting at q.1 h.  Her diet was slowly advanced, and she was slowly mobilized.  She  tolerated oral narcotics as her diet was advanced and IV medicines were  discontinued.  She is maintained on Ancef throughout her  hospitalization.  She had no problems tolerating the advancement of her  free TRAM flap protocol.  Her free flap has maintained excellent  perfusion by clinical examination.  On the day of discharge, she is  tolerating a regular diet.  She is comfortable on Vicodin.  She has been  afebrile.  She is independent with her ambulation and comfortable with  her JP drain care.   She is being given instructions to call Dr. Noelle Penner  at [phone] if she experiences nausea, vomiting, fevers, chills, pain  not relieved by pain medicine, any questions or concerns.  She is being  given a prescription for Vicodin 1-2 tablets p.o. q.4 h. p.r.n. for  pain, dispensed 60.  She is also being told to take Colace 100 mg p.o.  twice a day while taking Vicodin and aspirin 81 mg p.o. daily for 2  weeks.  She will follow up with Dr. Noelle Penner on June 19, 2008, and is to  call his office to schedule that appointment.  She is to call Dr. Billey Chang  office and schedule followup with him in two weeks' time.      Loreta Ave, MD  Electronically Signed     CF/MEDQ  D:  06/15/2008  T:  06/15/2008  Job:  [id number]

## 2010-08-03 NOTE — Op Note (Signed)
Lori Cowan, CORLE NO.:  [id number]   MEDICAL RECORD NO.:  [id number]          PATIENT TYPE:  INP   LOCATION:  5118                         FACILITY:  [facility]   PHYSICIAN:  Ollen Gross. Vernell Morgans, M.D. DATE OF BIRTH:  1961-02-15   DATE OF PROCEDURE:  06/12/2008  DATE OF DISCHARGE:                               OPERATIVE REPORT   PREOPERATIVE DIAGNOSIS:  Left breast cancer with previous lumpectomy and  positive margin.   POSTOPERATIVE DIAGNOSIS:  Left breast cancer with previous lumpectomy  and positive margin.   PROCEDURE:  Left mastectomy and placement of right subclavian vein Port-  A-Cath.   SURGEON:  Ollen Gross. Vernell Morgans, MD   ASSISTANT:  Dr. Janee Morn.   ANESTHESIA:  General endotracheal.   PROCEDURE:  After informed consent was obtained, the patient was brought  to the operating room and placed in the supine position on the operating  table.  After adequate induction of general anesthesia, the patient's  bilateral breast, chest, abdominal area, and neck area were prepped with  Hibiclens and draped in the usual sterile manner.  Attention was first  turned to the Port-A-Cath.  The patient was placed in Trendelenburg  position, and a small incision was made just lateral to the bend of the  clavicle on the right upper chest.  A large bore-finder needle from the  Port-A-Cath kit was used to gently slide beneath the bend of the  clavicle aiming towards the sternal notch and doing this, we are able to  access the right subclavian vein without difficulty.  A wire was placed  through the needle using the Seldinger technique without difficulty.  The wire was confirmed in the central venous system using real-time  fluoroscopy, and the needle was then removed.  The incision on the right  chest was then extended medially and laterally a short distance.  A  subcutaneous pocket was created by combination of blunt finger  dissection and sharp dissection with the  electrocautery.  The tubing was  then placed on the well of the port, well of the port was placed in the  pocket, and the length of the tubing was estimated also using real-time  fluoroscopy, and was cut to about 16 cm.  A sheath and dilator were then  placed over the wire and also using the Seldinger technique without  difficulty, the dilator and wire were then removed.  The thumb was  placed over the opening sheath.  The tubing on the catheter was then fed  through the sheath as far as it would go, the sheath was then gently  cracked and separated keeping the tubing in place.  Once this was  accomplished, the tip of the catheter was confirmed in the superior vena  cava also using real-time fluoroscopy.  The anchor was then placed on  the tubing and the tubing was anchored to the well.  The catheter was  then anchored in the pocket with two interrupted 2-0 Prolene stitches.  The subcutaneous tissue was closed over the port with interrupted 3-0  Vicryl stitches.  Skin was closed with a  running 4-0 Monocryl  subcuticular stitch.  The port was then aspirated and aspirated blood  easily and then flushed initially with a dilute heparin solution then  with a more concentrated heparin solution.  The Dermabond dressing was  applied.  The patient tolerated this portion of the procedure well.  Attention was then turned to the left breast.  An elliptical incision  around the nipple-areolar complex as well as the old scar was created  with a 10-blade knife.  This incision was carried down through the skin  and subcutaneous tissue sharply with the electrocautery.  Skin hooks  were then used to elevate the skin perhaps anteriorly towards the  ceiling and thin skin flaps were created circumferentially around the  incision and this dissection was carried all the way down to meet the  chest wall.  The dissection was also carried out laterally to the chest  wall and latissimus area.  Once this was  accomplished, the breast was  then removed from the chest wall along with the pectoralis fascia.  This  was also done sharply with the electrocautery.  Once this was done, the  breast was removed from the patient.  A stitch was used to mark the  lateral aspect and it was sent to pathology for further evaluation.  Hemostasis was achieved using the Bovie electrocautery.  The wound was  irrigated with copious amounts of saline.  Skin flaps appeared to be  healthy.  At this point, the operative bed was covered with moistened  gauze and Dr. Noelle Penner in Plastic Surgery was called to takeover for the  reconstruction portion.  The patient was in stable condition.  The rest  of the dictation will be dictated by him for the reconstruction.      Ollen Gross. Vernell Morgans, M.D.  Electronically Signed     PST/MEDQ  D:  06/12/2008  T:  06/13/2008  Job:  [id number]

## 2010-08-03 NOTE — Op Note (Signed)
Lori Cowan, Lori Cowan NO.:  [id number]   MEDICAL RECORD NO.:  [id number]          PATIENT TYPE:  INP   LOCATION:  5118                         FACILITY:  [facility]   PHYSICIAN:  Loreta Ave, MD DATE OF BIRTH:  [birth year]   DATE OF PROCEDURE:  06/12/2008  DATE OF DISCHARGE:                               OPERATIVE REPORT   PREOPERATIVE DIAGNOSIS:  Left breast cancer.   POSTOPERATIVE DIAGNOSIS:  Left breast cancer.   PROCEDURE PERFORMED:  Free TRAM flap to left breast, mastectomy defect,  and please note that Dr. Carolynne Edouard performed a mastectomy on the same day and  his note will be dictated separately.   IV FLUIDS:  3 liters of crystalloid, 1 liter of [place].   URINE OUTPUT:  900 mL.   ESTIMATED BLOOD LOSS:  200 mL.   COMPLICATIONS:  None.   ANESTHESIA:  General endotracheal.   ASSISTANT:  Pleas Patricia, MD   COMPLICATIONS:  None.   DRAINS:  Jackson-Pratt x4, two in the abdomen and two in the left  breast.   CLINICAL INDICATIONS:  Lori Cowan is 50 year old female with left-  sided breast cancer- she had residual DCIS at her previous lumpectomy  margin.  She is undergoing a left mastectomy today and will undergo a  free TRAM flap.  The patient understands that the risks of surgery  include but are not limited to bleeding, infection, damage to the nearby  structures, breast asymmetry partial or total flap loss, the need for  emergent flap reexploration, and the need for future surgery.  Sheleen  understands these risks and desires to proceed.   DESCRIPTION OF OPERATION:  I was called into the operating room as Dr.  Tawanna Cooler finished the left mastectomy.  The patient had been prepped with  chlorhexidine and Ancef given preoperatively.  SCDs were in place on her  legs.  A transverse ellipse was designed over the lower abdomen to  include the umbilicus and was omega shaped on its lower half.  I began  by injecting 1% lidocaine with 1:100,000  epinephrine along the superior  aspect the incision and along the medial 10 cm taking care to avoid the  area of the SIEA vessels.  As I waited for the hemostatic effects of the  epinephrine to take place, the skin was incised with a 10 blade over the  right lower quadrant SIEA vessels.  Dissection proceeded deeply with  electrocautery and no SIEA vessels were found on the right.  Next, the  skin over the left lower quadrant was incised and dissection proceeded  deeply with electrocautery.  The SIEA vessels were encountered on the  left side and traced to their origin.  This was done with tenotomy  scissors, small clips, and a Therapist, nutritional.  The vessels were separated  and the artery was found be approximately 1.5 mm in diameter at this  point.  It was saved for later use, although I considered too small to  perfuse the entire flap, which Laurence would need to approximate the  size of her right breast.  The SIEA vessels in the left  were clipped,  divided and packed with a warm, moist lap sponge.  Attention was then  turned to the superior aspect of the incision, which was incised with a  template and carried down to the fascia with electrocautery.  The  superior flap was then elevated with electrocautery to the costal  margin. The umbilicus was then cut free in a triangle pointing  inferiorly and care was taken to leave fat around the umbilicus to  ensure its viability.  Next, the lower midline was incised to connect  all portions of the incision.  This was done with a ten blade and  dissection proceeded deeply with electrocautery.  Next, attention was  turned to the right side of the hemiabdomen and this was elevated from  its lateral aspect proceeding medially.  This was done with  electrocautery until the lateral row perforators were identified.  Next,  dissection proceeded on the deep aspect of the left hemiabdomen to the  level of the lateral row perforators.  At this point, the  configuration  would be donor vessels to the IMAs, which would favor a left free TRAM.  For this reason, the lateral row was divided on the right side and the  medial row on the right side was consisting of three perforators, which  were relatively small.  For this reason, they were divided and  dissection proceeded across the midline towards the left.  There were 4  good size (1-2 mm) vessels in the medial row on the left.  For this  reason, I decided to explore the option of doing a muscle sparing with  left free TRAM.  Attention was then turned to the left mastectomy  pocket.   The left mastectomy pocket was irrigated with normal saline and  inspected for hemostasis, which Dr. Tawanna Cooler had obtained previously.  The  first branch of the serratus was isolated off the thoracodorsal system  and these vessels were dissected proximally for approximately 4 cm and  separated from each other with small clips and tenotomy scissors.  The  vein was 3 mm in diameter and the artery was 1.5 mm in diameter.  Upon  testing the left first arterial branch of the serratus.  It did not have  pulsatile flow.  For this reason, it was clipped.  Next, the decision  was made to dissect the internal mammary vessels underneath the third  rib.  The third rib was identified and incised on its anterior aspect  with electrocautery with an incision 4 cm long in the periosteum.  The  periosteum was elevated with a Therapist, nutritional circumferentially around  the rib and the rib was removed piecemeal with a rongeur.  This revealed  the left internal mammary vessels underneath the periosteum.  The  periosteum was then incised above the vessels with a 10 blade.  The  vessels were separated from each other and traced inferiorly to the  fourth rib.  At that point, they were clipped distally and divided after  placing a bulldog on both the artery and the vein proximally.  They were  irrigated with normal saline and flow was  checked.  The artery was  approximately 2.5 mm in diameter with excellent, pulsatile flow.  The  vein was also 2.5 mm in diameter.  Attention was then returned to the  abdomen.   Decision was made to proceed with a left muscle-sparing free TRAM.  The  fascia just lateral to the lateral row was incised and the  fascial  incision was carried down to the left lower quadrant overlying the deep  inferior epigastric pedicle.  The deep inferior epigastrics cyst were  traced to their origin and separated along the distal 4 cm.  This was  done with small and medium clips and tenotomy scissors.  The artery and  vein were approximately 2.5-3 mm in diameter of the origin.  These were  clipped and divided distally.  Next, a dissection began over the rectus  muscle just lateral to lateral row.  This was done with bipolar  electrocautery taking care to ensure that the deep inferior epigastric  pedicle coursed along the medial aspect of the rectus muscle, which she  did.  This left a swath of 5 cm laterally off the rectus muscle intact.  Once the underlying pedicle was free to the level of the proposed muscle  and fascial islands, the fascia was then incised circumferentially  around the medial row pedicles.  The muscle was then divided with  electrocautery set on 60, first inferiorly below the proposed free flap  and then dissection proceeded freeing all the deep attachments from the  rectus muscle and the deep inferior epigastric pedicle to surrounding  structures.  This was done with electrocautery and perforators were  clipped and divided.  Next, the superior aspect of the rectus muscle was  cut with electrocautery set on 60.  This produced hemostasis of the  muscle and the flap was then free of all connections.  The wound was  irrigated and the abdomen with normal saline and hemostasis verified.  Wet moist lap sponges were placed in the abdomen.  Attention was then  turned to the left mastectomy  defects.  The flap was transposed to the  left mastectomy defect and skin was stapled into place to hold the flap  while the anastomosis was done.  Operating room microscope was brought  onto the field and a Synovis coupler was used to couple the vein.  Extra  tissue was excised with micro scissors from around the vein to ensure  that only the vein passed through the coupler both on the recipient and  donor vessels.  The vein was then coupled with a 2.5-mm coupler without  incident.  Next, attention was turned to the artery.  Extra adventitia  was stripped off and the arterial edges were trimmed to be transverse  and free of redundant tissue.  Next, 9-0 nylon on a BV 130-4 needle was  then used.  Corner stitches were placed and double approximating bulldog  clamp was also placed at the area of the anastomosis.  The artery was  then flipped and its back wall was sewn with three interrupted 9-0 nylon  sutures.  It was then flipped and the anterior wall was used to the  anterior aspect of the back wall anastamosis.  It was found to be free  of mechanical flaws and irrigated with normal saline.  Next, three  interrupted 9-0 nylon suture were replaced on the anterior wall and the  anastomosis was irrigated with heparinized normal saline.  There was no  leak at this point, so the bulldogs were removed from the flap site  first and then from the IMA pedicle.  This produced some leaking, which  was controlled with one interrupted 9-0 nylon suture.  This was placed  with the artery flowing.  Hemostasis was ensured visually.  Next,  papaverine was injected along the pedicle and a warm moist lap sponge  was  packed around it.  The pedicle of the flap had a palpable pulse at  this point.  Attention was then returned to the abdomen.   The fascial defect in the anterior abdominal wall was closed with 0  Prolene interrupted figure-of-eight, buried sutures.  The fascial defect  was approximately 2.5 cm  wide.  It was easily closed in a minimal  tension fashion with Prolene sutures.  Next, the abdomen was again  irrigated.  An On-Q pain pump with 0.25% Marcaine was threaded  through  separate stab incisions in the skin over the mons pubis.  Next, two 59-  [place] round Blake drains were placed via separate stab incisions and  secured to skin with silk sutures.  The bed and patient were flexed into  semi-fowler's position to facilitate closure.  The skin of the abdomen  was then closed with 2-0 PDS sutures in this fascial layer and a  combination of interrupted 3-0 Monocryl sutures and a running 3-0  Monocryl subcuticular stitch.  The umbilicus was then delivered through  a chevron-shaped incision and the skin and sewn into place with 4-0  Monocryl sutures.  Attention was then turned to the right TRAM.  It was  inset and the skin ellipse was outlined on the free flap.  The skin  staples were then removed and the buried portion de-epithelialized with  black handle scissors.  The flap was then sewn in circumferentially with  interrupted 3-0 Vicryl pop-off sutures.  During inset, the pedicle was  inspected and found to be free of kinks or mechanical flaws with a  palpable pulse.  Doppler signal was the used to listen to the Doppler  signal on the skin paddle as well.  Next, the skin of the TRAM flap was  sewn with interrupted buried 3-0 Monocryl sutures and running 4-0  subcuticular stitch.  Sponge and needle counts reported as correct x2.  A Dermabond and Steri-Strips were applied to all incisions.  Two round  19-French Blake drains were placed via separate stab incisions in the  left mid axillary line at the level the inferior breast before closure.  They were secured to the skin with silk sutures and suction was applied.  After the skin was closed, a 5-0 Prolene suture was placed at the  location of the audible Doppler signal on the skin paddle of the left  free TRAM.  The patient was then  extubated and transferred to the  recovery room in semi [place] position.      Loreta Ave, MD  Electronically Signed     CF/MEDQ  D:  06/12/2008  T:  06/13/2008  Job:  [id number]

## 2010-08-03 NOTE — Op Note (Signed)
NAMEPAISLEY, Lori Cowan              ACCOUNT NO.:  [id number]   MEDICAL RECORD NO.:  [id number]          PATIENT TYPE:  AMB   LOCATION:  SDS                          FACILITY:  [facility]   PHYSICIAN:  Ollen Gross. Vernell Morgans, M.D. DATE OF BIRTH:  [birth year]   DATE OF PROCEDURE:  04/09/2008  DATE OF DISCHARGE:  04/09/2008                               OPERATIVE REPORT   PREOPERATIVE DIAGNOSIS:  Left breast ductal carcinoma in situ.   POSTOPERATIVE DIAGNOSIS:  Left breast ductal carcinoma in situ.   PROCEDURE:  Left breast needle-localized lumpectomy with sentinel node  biopsy x5 and injection of blue dye.   SURGEON:  Ollen Gross. Vernell Morgans, MD   ANESTHESIA:  General via LMA.   PROCEDURE:  After informed consent was obtained, the patient was brought  to the operating room and placed in supine position on the operating  room table.  After adequate induction of general anesthesia, the  patient's left chest, breast, and axilla were prepped with Betadine and  draped in usual sterile manner.  Earlier in the day, the patient had  undergone injection of 1 mCi of technetium sulfur colloid in the  subareolar position.  The patient had also previously been undergone  wire localization of the area in question, which was in about the 6  o'clock position in the left breast.  The wire was entering the breast  inferiorly.  At this point, 2 mL of methylene blue and 3 mL of  injectable saline were also injected in the subareolar position and  breast was massaged for several minutes.  NeoProbe was used to identify  an increased radioactivity in the left axilla.  A small transverse  incision was made overlying this hot spot.  This incision was carried  down through the skin and subcutaneous tissue sharply with  electrocautery until the axilla was entered.  Weitlaner retractor was  deployed.  Blunt hemostat dissection was carried out in the axilla with  the direction of the NeoProbe.  We were able to identify 5  sentinel  nodes that were hot and blue.  All of whom had ex vivo counts between  200 and 300, each of these was dissected free by combination of blunt  hemostat and some sharp dissection with electrocautery.  The lymphatics  stitch were then clamped with hemostats, divided and ligated with 3-0  Vicryl ties.  All of this touch preps on the sentinel nodes were  negative.  At this point, no other palpable nodes, blue nodes, or  increased radioactivity were identified in the left axilla.  The axilla  was examined and found to be hemostatic.  The deep layer of the wound  was closed with interrupted 3-0 Vicryl stitches.  The skin was closed a  running 4-0 Monocryl subcuticular stitch.  Attention was then turned to  the left breast.  A radial inferior incision was made, this was done  with a 15-blade knife.  This incision was carried down through the skin  into the subcutaneous tissue sharply with electrocautery.  The  subcutaneous flaps were then created inferiorly.  The path of the  wire  was then able to be identified where it came through the skin.  A  circular portion of breast tissue was excised sharply around the path of  the wire.  This was extended laterally to the an area where previous  biopsy marker was placed that had similar type calcifications.  This  marker was identified during the dissection and the whole specimen was  carried down to the chest wall.  Once the specimen was removed, it was  oriented with a short stitch superior, long stitch lateral.  The wire  was entering inferior and anterior, and another short double stitch was  placed superior, long double stitch was placed lateral and another short  single stitch was used to mark the second biopsy clip marker.  These  were all sent to radiology where as specimen radiograph showed the two  clips to be in the specimen and was then sent to pathology for further  evaluation.  Hemostasis was achieved using the Bovie  electrocautery.  The wound was infiltrated with 0.25% Marcaine and wound was irrigated  with copious amounts of saline.  The superior portion of breast tissue  in the cavity was very mobile and easily approximated at the inferior  portion of the cavity and it was tacked there with a couple of  interrupted 3-0 Vicryl stitches.  The skin was very mobile overlying  this breast tissue and it was closed with a running 4-0 Monocryl  subcuticular stitch.  Dermabond dressings were then applied.  The  patient tolerated the procedure well.  At the end of the case, all  needle, sponge, and instrument counts were correct.  The patient was  then awakened and taken to recovery room in stable condition.      Ollen Gross. Vernell Morgans, M.D.  Electronically Signed     PST/MEDQ  D:  04/09/2008  T:  04/10/2008  Job:  [id number]

## 2010-08-10 ENCOUNTER — Other Ambulatory Visit: Payer: Self-pay | Admitting: Oncology

## 2010-08-10 ENCOUNTER — Encounter (HOSPITAL_BASED_OUTPATIENT_CLINIC_OR_DEPARTMENT_OTHER): Payer: 59 | Admitting: Oncology

## 2010-08-10 DIAGNOSIS — C50119 Malignant neoplasm of central portion of unspecified female breast: Secondary | ICD-10-CM

## 2010-08-10 LAB — COMPREHENSIVE METABOLIC PANEL
ALT: 14 U/L (ref 0–35)
AST: 18 U/L (ref 0–37)
Alkaline Phosphatase: 102 U/L (ref 39–117)
Calcium: 9.8 mg/dL (ref 8.4–10.5)
Chloride: 102 mEq/L (ref 96–112)
Creatinine, Ser: 0.89 mg/dL (ref 0.40–1.20)
Potassium: 3.7 mEq/L (ref 3.5–5.3)

## 2010-08-10 LAB — CBC WITH DIFFERENTIAL/PLATELET
BASO%: 0.4 % (ref 0.0–2.0)
EOS%: 1.6 % (ref 0.0–7.0)
MCH: 31.8 pg (ref 25.1–34.0)
MCHC: 34.3 g/dL (ref 31.5–36.0)
NEUT%: 57.8 % (ref 38.4–76.8)
RDW: 12.6 % (ref 11.2–14.5)
lymph#: 2.8 10*3/uL (ref 0.9–3.3)

## 2010-08-12 ENCOUNTER — Other Ambulatory Visit: Payer: Self-pay | Admitting: Oncology

## 2010-08-12 DIAGNOSIS — Z9010 Acquired absence of unspecified breast and nipple: Secondary | ICD-10-CM

## 2010-10-11 ENCOUNTER — Ambulatory Visit
Admission: RE | Admit: 2010-10-11 | Discharge: 2010-10-11 | Disposition: A | Discharge status: Home / Self Care | Payer: 59 | Source: Ambulatory Visit | Attending: Oncology | Admitting: Oncology

## 2010-10-11 DIAGNOSIS — Z9010 Acquired absence of unspecified breast and nipple: Secondary | ICD-10-CM

## 2011-02-17 ENCOUNTER — Ambulatory Visit (HOSPITAL_BASED_OUTPATIENT_CLINIC_OR_DEPARTMENT_OTHER): Payer: 59 | Admitting: Oncology

## 2011-02-17 ENCOUNTER — Other Ambulatory Visit: Payer: Self-pay | Admitting: Oncology

## 2011-02-17 ENCOUNTER — Other Ambulatory Visit (HOSPITAL_BASED_OUTPATIENT_CLINIC_OR_DEPARTMENT_OTHER): Payer: 59 | Admitting: Lab

## 2011-02-17 ENCOUNTER — Telehealth: Payer: Self-pay | Admitting: Oncology

## 2011-02-17 DIAGNOSIS — Z1231 Encounter for screening mammogram for malignant neoplasm of breast: Secondary | ICD-10-CM

## 2011-02-17 DIAGNOSIS — C50119 Malignant neoplasm of central portion of unspecified female breast: Secondary | ICD-10-CM

## 2011-02-17 DIAGNOSIS — E559 Vitamin D deficiency, unspecified: Secondary | ICD-10-CM

## 2011-02-17 DIAGNOSIS — C50919 Malignant neoplasm of unspecified site of unspecified female breast: Secondary | ICD-10-CM

## 2011-02-17 DIAGNOSIS — Z170 Estrogen receptor positive status [ER+]: Secondary | ICD-10-CM

## 2011-02-17 LAB — COMPREHENSIVE METABOLIC PANEL
ALT: 17 U/L (ref 0–35)
AST: 20 U/L (ref 0–37)
Albumin: 4.2 g/dL (ref 3.5–5.2)
Calcium: 9.9 mg/dL (ref 8.4–10.5)
Chloride: 101 mEq/L (ref 96–112)
Creatinine, Ser: 1.05 mg/dL (ref 0.50–1.10)
Potassium: 3.8 mEq/L (ref 3.5–5.3)
Sodium: 137 mEq/L (ref 135–145)
Total Protein: 7.2 g/dL (ref 6.0–8.3)

## 2011-02-17 LAB — CBC WITH DIFFERENTIAL/PLATELET
BASO%: 0.2 % (ref 0.0–2.0)
LYMPH%: 22.9 % (ref 14.0–49.7)
MCHC: 33.7 g/dL (ref 31.5–36.0)
MONO#: 0.2 10*3/uL (ref 0.1–0.9)
MONO%: 3.1 % (ref 0.0–14.0)
Platelets: 358 10*3/uL (ref 145–400)
RBC: 4.16 10*6/uL (ref 3.70–5.45)
RDW: 12.7 % (ref 11.2–14.5)
WBC: 7.4 10*3/uL (ref 3.9–10.3)

## 2011-02-17 NOTE — Progress Notes (Signed)
Hematology and Oncology Follow Up Visit  Lori Cowan [id number] [birth year] 49 y.o. 02/17/2011 4:06 PM Principle Diagnosis:  HER-2 positive breast cancer diagnosed 02/07/2008, status post [facility] chemotherapy given neoadjuvant rate x6 cycles., Followed by one year of Herceptin. Pathological complete response seen both in the breast and in lymph nodes, residual DCIS with positive margin. Status post left mastectomy with TRAM flap 06/12/2008.       Interim History:  She has been doing well , working without difficulty and has been feeling well.  Medications: I have reviewed the patient's current medications.  Allergies: Allergies not on file  Past Medical History, Surgical history, Social history, and Family History were reviewed and updated.  Review of Systems: Constitutional:  Negative for fever, chills, night sweats, anorexia, weight loss, pain. Cardiovascular: no chest pain or dyspnea on exertion Respiratory: no cough, shortness of breath, or wheezing Neurological: no TIA or stroke symptoms Dermatological: negative ENT: negative Skin Gastrointestinal: negative Genito-Urinary: negative Hematological and Lymphatic: negative Breast: negative Musculoskeletal: negative Remaining ROS negative.  Physical Exam: There were no vitals taken for this visit. ECOG: 0 HEENT:  Sclerae anicteric, conjunctivae pink.  Oropharynx clear.  No mucositis or candidiasis.  Nodes:  No cervical, supraclavicular, or axillary lymphadenopathy palpated.  Breast Exam:  Right breast is benign.  No masses, discharge, skin change, or nipple inversion.  Left breast is s/p mastectomy with TRAM   No masses, discharge, skin change, or nipple inversion..  Lungs:  Clear to auscultation bilaterally.  No crackles, rhonchi, or wheezes.  Heart:  Regular rate and rhythm.  Abdomen:  Soft, nontender.  Positive bowel sounds.  No organomegaly or masses palpated.  Musculoskeletal:  No focal spinal tenderness to  palpation.  Extremities:  Benign.  No peripheral edema or cyanosis.  Skin:  Benign.  Neuro:  Nonfocal.   Lab Results: Lab Results  Component Value Date   WBC 7.4 02/17/2011   HGB 13.2 02/17/2011   HCT 39.2 02/17/2011   MCV 94.2 02/17/2011   PLT 358 02/17/2011     Chemistry      Component Value Date/Time   NA 138 08/10/2010 1545   K 3.7 08/10/2010 1545   CL 102 08/10/2010 1545   CO2 27 08/10/2010 1545   BUN 17 08/10/2010 1545   CREATININE 0.89 08/10/2010 1545      Component Value Date/Time   CALCIUM 9.8 08/10/2010 1545   ALKPHOS 102 08/10/2010 1545   AST 18 08/10/2010 1545   ALT 14 08/10/2010 1545   BILITOT 0.3 08/10/2010 1545       Radiological Studies: chest X-ray n/a Mammogram  Bone density   Impression and Plan: Lori Cowan is doing well, she will be seen in  6months.  More than 50% of the visit was spent in patient-related counselling   Pierce Crane, MD 11/29/20124:06 PM

## 2011-02-17 NOTE — Telephone Encounter (Signed)
Gv pt appt for [id number].  schedule pt for mammogram and bone density for [id number]

## 2011-03-03 ENCOUNTER — Encounter (INDEPENDENT_AMBULATORY_CARE_PROVIDER_SITE_OTHER): Payer: Self-pay | Admitting: General Surgery

## 2011-04-19 ENCOUNTER — Ambulatory Visit (INDEPENDENT_AMBULATORY_CARE_PROVIDER_SITE_OTHER): Payer: 59 | Admitting: General Surgery

## 2011-05-23 ENCOUNTER — Encounter (INDEPENDENT_AMBULATORY_CARE_PROVIDER_SITE_OTHER): Payer: Self-pay | Admitting: General Surgery

## 2011-05-24 ENCOUNTER — Ambulatory Visit (INDEPENDENT_AMBULATORY_CARE_PROVIDER_SITE_OTHER): Payer: 59 | Admitting: General Surgery

## 2011-05-24 ENCOUNTER — Encounter (INDEPENDENT_AMBULATORY_CARE_PROVIDER_SITE_OTHER): Payer: Self-pay | Admitting: General Surgery

## 2011-05-24 VITALS — BP 106/68 | HR 66 | Temp 97.9°F | Resp 16 | Ht 70.0 in | Wt 177.4 lb

## 2011-05-24 DIAGNOSIS — C50919 Malignant neoplasm of unspecified site of unspecified female breast: Secondary | ICD-10-CM

## 2011-05-24 NOTE — Patient Instructions (Signed)
Continue to do regular self exams 

## 2011-05-24 NOTE — Progress Notes (Signed)
Subjective:     Patient ID: Lori Cowan, female   DOB: [birth year], 51 y.o.   MRN: [id number]  HPI The patient is a 51 year old female who is 3 years out from a left mastectomy for a T1 A. N0 breast cancer. She had a reconstruction done by Dr. Noelle Penner. In the last year she has been well. She has had no medical problems. She denies any real breast pain. She denies any discharge from her nipple. Her next mammogram is due in July.  Review of Systems  Constitutional: Negative.   HENT: Negative.   Eyes: Negative.   Respiratory: Negative.   Cardiovascular: Negative.   Gastrointestinal: Negative.   Genitourinary: Negative.   Musculoskeletal: Negative.   Skin: Negative.   Neurological: Negative.   Hematological: Negative.   Psychiatric/Behavioral: Negative.        Objective:   Physical Exam  Constitutional: She is oriented to person, place, and time. She appears well-developed and well-nourished.  HENT:  Head: Normocephalic and atraumatic.  Eyes: Conjunctivae and EOM are normal. Pupils are equal, round, and reactive to light.  Neck: Normal range of motion. Neck supple.  Cardiovascular: Normal rate, regular rhythm and normal heart sounds.   Pulmonary/Chest: Effort normal and breath sounds normal.       Her left breast reconstruction looks very good. She has no palpable mass in either breast. No axillary subclavicular cervical lymphadenopathy  Abdominal: Soft. Bowel sounds are normal. She exhibits no mass. There is no tenderness.  Musculoskeletal: Normal range of motion.  Lymphadenopathy:    She has no cervical adenopathy.  Neurological: She is alert and oriented to person, place, and time.  Skin: Skin is warm and dry.  Psychiatric: She has a normal mood and affect. Her behavior is normal.       Assessment:     3 years status post left mastectomy with reconstruction    Plan:     Overall she's doing very well. She will continue to do regular self exams. We will plan  to see her back in one year. She will see Dr. Donnie Coffin in about 6 months

## 2011-08-19 ENCOUNTER — Ambulatory Visit (HOSPITAL_BASED_OUTPATIENT_CLINIC_OR_DEPARTMENT_OTHER): Payer: 59 | Admitting: Oncology

## 2011-08-19 ENCOUNTER — Other Ambulatory Visit (HOSPITAL_BASED_OUTPATIENT_CLINIC_OR_DEPARTMENT_OTHER): Payer: 59 | Admitting: Lab

## 2011-08-19 VITALS — BP 111/72 | HR 57 | Temp 98.1°F | Ht 70.0 in | Wt 180.5 lb

## 2011-08-19 DIAGNOSIS — Z1231 Encounter for screening mammogram for malignant neoplasm of breast: Secondary | ICD-10-CM

## 2011-08-19 DIAGNOSIS — C50919 Malignant neoplasm of unspecified site of unspecified female breast: Secondary | ICD-10-CM

## 2011-08-19 DIAGNOSIS — Z5111 Encounter for antineoplastic chemotherapy: Secondary | ICD-10-CM

## 2011-08-19 DIAGNOSIS — E559 Vitamin D deficiency, unspecified: Secondary | ICD-10-CM

## 2011-08-19 DIAGNOSIS — C50119 Malignant neoplasm of central portion of unspecified female breast: Secondary | ICD-10-CM

## 2011-08-19 NOTE — Progress Notes (Signed)
The patient brought up the opportunity to do additional genetic testing. I discussed the implications of BART testing with her. As electronically when she was originally tested. There are some additional test that can be performed as well. I therefore refer her back genetics for the these tests to be performed if a reasonable.  Pierce Crane M.D. FRCP C.

## 2011-08-19 NOTE — Progress Notes (Signed)
Hematology and Oncology Follow Up Visit  ANGELLA MONTAS [facility] [id number] Apr 22, 1960 51 y.o. 08/19/2011 6:18 PM   DIAGNOSIS: HER-2 positive breast cancer diagnosed 02/07/2008, status post [facility] chemotherapy given neoadjuvant rate x6 cycles., Followed by one year of Herceptin. Pathological complete response seen both in the breast and in lymph nodes, residual DCIS with positive margin. Status post left mastectomy with TRAM flap 06/12/2008.   Encounter Diagnosis  Name Primary?  . Breast cancer Yes     PAST THERAPY: As above   Interim History:  Patient is doing well. She has no complaints. She is on minimal medications. She is due for a followup mammogram in July as well as a bone density test which has been scheduled already. Appetite good weight is stable. She denies headaches blurred vision shortness of breath or cough. She denies numbness tingling in hands or feet. 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  Medications: I have reviewed the patient's current medications.  Allergies:  Allergies  Allergen Reactions  . Tape Rash    Past Medical History, Surgical history, Social history, and Family History were reviewed and updated.  Review of Systems: Constitutional:  Negative for fever, chills, night sweats, anorexia, weight loss, pain. Cardiovascular: no chest pain or dyspnea on exertion Respiratory: negative Neurological: negative Dermatological: negative ENT: negative Skin Gastrointestinal: negative Genito-Urinary: negative Hematological and Lymphatic: negative Breast: negative Musculoskeletal: negative Remaining ROS negative.  Physical Exam:  Blood pressure 111/72, pulse 57, temperature 98.1 F (36.7 C), temperature source Oral, height 5\' 10"  (1.778 m), weight 180 lb 8 oz (81.874 kg).  ECOG: 0   General appearance: alert, cooperative and appears stated age Eyes: conjunctivae/corneas clear. PERRL, EOM's  intact. Fundi benign. Throat: lips, mucosa, and tongue normal; teeth and gums normal Resp: clear to auscultation bilaterally and normal percussion bilaterally Breasts: normal appearance, no masses or tenderness, left breast is status post mastectomy now with TRAM flap. No masses in either breast, both axilla negative. Cardio: regular rate and rhythm, S1, S2 normal, no murmur, click, rub or gallop and normal apical impulse Extremities: extremities normal, atraumatic, no cyanosis or edema Pulses: 2+ and symmetric Lymph nodes: Cervical, supraclavicular, and axillary nodes normal. Neurologic: Grossly normal   Lab Results: Lab Results  Component Value Date   WBC 7.4 02/17/2011   HGB 13.2 02/17/2011   HCT 39.2 02/17/2011   MCV 94.2 02/17/2011   PLT 358 02/17/2011     Chemistry      Component Value Date/Time   NA 137 02/17/2011 1533   K 3.8 02/17/2011 1533   CL 101 02/17/2011 1533   CO2 28 02/17/2011 1533   BUN 21 02/17/2011 1533   CREATININE 1.05 02/17/2011 1533      Component Value Date/Time   CALCIUM 9.9 02/17/2011 1533   ALKPHOS 82 02/17/2011 1533   AST 20 02/17/2011 1533   ALT 17 02/17/2011 1533   BILITOT 0.4 02/17/2011 1533       Radiological Studies:  No results found.   IMPRESSIONS AND PLAN: A 51 y.o. female with   History of HER-2 positive breast cancer status post neoadjuvant therapy with complete pathological response, now on followup in the third year of followup. Her labwork is not currently available, I have recommended she take additional vitamin D. I will see her in 6 months time. Spent more than half the time coordinating care.    Nikira Kushnir 5/31/20136:18 PM

## 2011-09-15 ENCOUNTER — Ambulatory Visit: Payer: 59 | Admitting: Genetic Counselor

## 2011-09-15 ENCOUNTER — Other Ambulatory Visit: Payer: 59 | Admitting: Lab

## 2011-09-15 NOTE — Progress Notes (Signed)
Patient did not show for her appointment.

## 2011-09-21 ENCOUNTER — Telehealth: Payer: Self-pay | Admitting: *Deleted

## 2011-09-21 NOTE — Telephone Encounter (Signed)
Left message for pt to return my call so I can reschedule her genetic appt. 

## 2011-10-10 ENCOUNTER — Telehealth: Payer: Self-pay | Admitting: *Deleted

## 2011-10-10 NOTE — Telephone Encounter (Signed)
Confirmed 12/01/11 genetic appt w/ pt & confirmed 02/23/12 f/u appt w/ pt.  She was unaware.

## 2011-10-17 ENCOUNTER — Ambulatory Visit
Admission: RE | Admit: 2011-10-17 | Discharge: 2011-10-17 | Disposition: A | Discharge status: Home / Self Care | Payer: 59 | Source: Ambulatory Visit | Attending: Oncology | Admitting: Oncology

## 2011-10-17 DIAGNOSIS — C50919 Malignant neoplasm of unspecified site of unspecified female breast: Secondary | ICD-10-CM

## 2011-10-17 DIAGNOSIS — E559 Vitamin D deficiency, unspecified: Secondary | ICD-10-CM

## 2011-10-17 DIAGNOSIS — Z1231 Encounter for screening mammogram for malignant neoplasm of breast: Secondary | ICD-10-CM

## 2011-10-25 ENCOUNTER — Other Ambulatory Visit: Payer: Self-pay

## 2011-10-25 ENCOUNTER — Other Ambulatory Visit: Payer: Self-pay | Admitting: Nurse Practitioner

## 2011-10-25 DIAGNOSIS — N950 Postmenopausal bleeding: Secondary | ICD-10-CM

## 2011-10-28 ENCOUNTER — Ambulatory Visit (HOSPITAL_COMMUNITY)
Admission: RE | Admit: 2011-10-28 | Discharge: 2011-10-28 | Disposition: A | Discharge status: Home / Self Care | Payer: 59 | Source: Ambulatory Visit | Attending: Nurse Practitioner | Admitting: Nurse Practitioner

## 2011-10-28 DIAGNOSIS — Z853 Personal history of malignant neoplasm of breast: Secondary | ICD-10-CM | POA: Insufficient documentation

## 2011-10-28 DIAGNOSIS — N950 Postmenopausal bleeding: Secondary | ICD-10-CM

## 2011-11-24 ENCOUNTER — Telehealth: Payer: Self-pay | Admitting: *Deleted

## 2011-11-24 NOTE — Telephone Encounter (Signed)
Patient called stating that she cannot get off work so I rescheduled and confirmed 01/16/12 genetic appt w/ pt.

## 2011-12-01 ENCOUNTER — Encounter: Payer: 59 | Admitting: Genetic Counselor

## 2011-12-01 ENCOUNTER — Other Ambulatory Visit: Payer: 59 | Admitting: Lab

## 2011-12-21 ENCOUNTER — Telehealth: Payer: Self-pay | Admitting: Oncology

## 2011-12-21 NOTE — Telephone Encounter (Signed)
Returned pt call re 48mo f/u appt ([phone] - number given by pt). Confirmed lb/fu for 12/5 and also genetics appt for 10/28. Pt also mentioned in vm that she was having persistent headaches. Message re headaches and next appts sent to desk nurse. Pt also informed of this in vm. Also noted in 10/10/11 phone note when pt was given initial genetics appt that 12/5 lb/fu was confirmed w/pt as she has been unaware.

## 2011-12-22 ENCOUNTER — Telehealth: Payer: Self-pay | Admitting: *Deleted

## 2011-12-22 ENCOUNTER — Encounter: Payer: Self-pay | Admitting: *Deleted

## 2011-12-22 NOTE — Telephone Encounter (Signed)
Per orders placed patient on 01-10-2012 at 9:00am

## 2012-01-10 ENCOUNTER — Ambulatory Visit (HOSPITAL_BASED_OUTPATIENT_CLINIC_OR_DEPARTMENT_OTHER): Payer: 59 | Admitting: Oncology

## 2012-01-10 VITALS — BP 109/68 | HR 62 | Temp 97.7°F | Resp 20 | Ht 70.0 in | Wt 181.4 lb

## 2012-01-10 DIAGNOSIS — D0590 Unspecified type of carcinoma in situ of unspecified breast: Secondary | ICD-10-CM

## 2012-01-10 DIAGNOSIS — C50919 Malignant neoplasm of unspecified site of unspecified female breast: Secondary | ICD-10-CM

## 2012-01-10 NOTE — Progress Notes (Signed)
Hematology and Oncology Follow Up Visit  Lori Cowan [id number] 10/12/1960 50 y.o. 01/10/2012 9:42 AM   DIAGNOSIS: HER-2 positive breast cancer diagnosed 02/07/2008, status post [facility] chemotherapy given neoadjuvant rate x6 cycles., Followed by one year of Herceptin. Pathological complete response seen both in the breast and in lymph nodes, residual DCIS with positive margin. Status post left mastectomy with TRAM flap 06/12/2008.   No diagnosis found.   PAST THERAPY: As above   Interim History:  Patient is doing well. She has no complaints. She is on minimal medications. She is due for a followup mammogram in July as well as a bone density test which has been scheduled already. Appetite good weight is stable. She denies headaches blurred vision shortness of breath or cough. She denies numbness tingling in hands or feet. 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  Medications: I have reviewed the patient's current medications.  Allergies:  Allergies  Allergen Reactions  . Tape Rash    Past Medical History, Surgical history, Social history, and Family History were reviewed and updated.  Review of Systems: Constitutional:  Negative for fever, chills, night sweats, anorexia, weight loss, pain. Cardiovascular: no chest pain or dyspnea on exertion Respiratory: negative Neurological: negative Dermatological: negative ENT: negative Skin Gastrointestinal: negative Genito-Urinary: negative Hematological and Lymphatic: negative Breast: negative Musculoskeletal: negative Remaining ROS negative.  Physical Exam:  Blood pressure 109/68, pulse 62, temperature 97.7 F (36.5 C), resp. rate 20, height 5\' 10"  (1.778 m), weight 181 lb 6.4 oz (82.283 kg).  ECOG: 0   General appearance: alert, cooperative and appears stated age Eyes: conjunctivae/corneas clear. PERRL, EOM's intact. Fundi benign. Throat: lips, mucosa, and tongue  normal; teeth and gums normal Resp: clear to auscultation bilaterally and normal percussion bilaterally Breasts: normal appearance, no masses or tenderness, left breast is status post mastectomy now with TRAM flap. No masses in either breast, both axilla negative. Cardio: regular rate and rhythm, S1, S2 normal, no murmur, click, rub or gallop and normal apical impulse Extremities: extremities normal, atraumatic, no cyanosis or edema Pulses: 2+ and symmetric Lymph nodes: Cervical, supraclavicular, and axillary nodes normal. Neurologic: Grossly normal   Lab Results: Lab Results  Component Value Date   WBC 7.4 02/17/2011   HGB 13.2 02/17/2011   HCT 39.2 02/17/2011   MCV 94.2 02/17/2011   PLT 358 02/17/2011     Chemistry      Component Value Date/Time   NA 137 02/17/2011 1533   K 3.8 02/17/2011 1533   CL 101 02/17/2011 1533   CO2 28 02/17/2011 1533   BUN 21 02/17/2011 1533   CREATININE 1.05 02/17/2011 1533      Component Value Date/Time   CALCIUM 9.9 02/17/2011 1533   ALKPHOS 82 02/17/2011 1533   AST 20 02/17/2011 1533   ALT 17 02/17/2011 1533   BILITOT 0.4 02/17/2011 1533       Radiological Studies:  No results found.   IMPRESSIONS AND PLAN: A 51 y.o. female with   History of HER-2 positive breast cancer status post neoadjuvant therapy with complete pathological response, now on followup in the third year of followup. Her labwork is not currently available, I have recommended she take additional vitamin D. I will see her in 6 months time. Spent more than half the time coordinating care.    Lori Cowan 10/22/20139:42 AM

## 2012-01-13 ENCOUNTER — Ambulatory Visit (HOSPITAL_COMMUNITY)
Admission: RE | Admit: 2012-01-13 | Discharge: 2012-01-13 | Disposition: A | Discharge status: Home / Self Care | Payer: 59 | Source: Ambulatory Visit | Attending: Oncology | Admitting: Oncology

## 2012-01-13 DIAGNOSIS — C50919 Malignant neoplasm of unspecified site of unspecified female breast: Secondary | ICD-10-CM | POA: Insufficient documentation

## 2012-01-13 DIAGNOSIS — R51 Headache: Secondary | ICD-10-CM | POA: Insufficient documentation

## 2012-01-13 MED ORDER — GADOBENATE DIMEGLUMINE 529 MG/ML IV SOLN
16.0000 mL | Freq: Once | INTRAVENOUS | Status: AC | PRN
  Administered 2012-01-13: 16 mL via INTRAVENOUS

## 2012-01-16 ENCOUNTER — Encounter: Payer: Self-pay | Admitting: Genetic Counselor

## 2012-01-16 ENCOUNTER — Other Ambulatory Visit: Payer: 59 | Admitting: Lab

## 2012-01-16 ENCOUNTER — Ambulatory Visit (HOSPITAL_BASED_OUTPATIENT_CLINIC_OR_DEPARTMENT_OTHER): Payer: 59 | Admitting: Genetic Counselor

## 2012-01-16 DIAGNOSIS — Z853 Personal history of malignant neoplasm of breast: Secondary | ICD-10-CM

## 2012-01-16 DIAGNOSIS — Z803 Family history of malignant neoplasm of breast: Secondary | ICD-10-CM

## 2012-01-16 DIAGNOSIS — C50919 Malignant neoplasm of unspecified site of unspecified female breast: Secondary | ICD-10-CM

## 2012-01-16 NOTE — Progress Notes (Signed)
Dr.  Pierce Crane requested a consultation for genetic counseling and risk assessment for Lori Cowan, a 51 y.o. female, for discussion of her personal history of breast cancer and family history of breast, colon, lung, and prostate cancer and melanoma. She presents to clinic today to discuss the possibility of a genetic predisposition to cancer, and to further clarify her risks, as well as her family members' risks for cancer.   HISTORY OF PRESENT ILLNESS: In 2009, at the age of 72, BRIAJA KINGSBURY was diagnosed with invasive ductal carcinoma of the breast. This was treated with chemotherapy and surgery.    Past Medical History  Diagnosis Date  . Fatigue   . Cancer     breast - left  . Menopause   . Family history of breast cancer   . Breast cancer 2009     at 60    Past Surgical History  Procedure Date  . Breast surgery     left mastectomy w/axillary dissection  . Breast surgery     tram flap reconstruction    History  Substance Use Topics  . Smoking status: Never Smoker   . Smokeless tobacco: Never Used  . Alcohol Use: Yes     socially    REPRODUCTIVE HISTORY AND PERSONAL RISK ASSESSMENT FACTORS: Menarche was at age 31.   Premenopausal Uterus Intact: Yes Ovaries Intact: Yes G5P2A3 , first live birth at age 34  She has not previously undergone treatment for infertility.   OCP use for 15 years   She has not used HRT in the past.    FAMILY HISTORY:  We obtained a detailed, 4-generation family history.  Significant diagnoses are listed below: Family History  Problem Relation Age of Onset  . Cancer Mother     mesothelioma  . Cancer Father 71    colon  . Hypertension Father   . Breast cancer Sister 84  . Melanoma Maternal Aunt     dx in her 30s  . Breast cancer Paternal Aunt     dx in her 77s  . Prostate cancer Paternal Uncle     dx in his 62s  the patient was diagnosed with breast cancer at age 46 and her sister was diagnosed with breast cancer at age  75.  The patients father was diagnosed with colon cancer at age 10.  He had a brother and a sister.  The sister had breast cancer in her 22s and the brother had prostate cancer his his 47s.  The patient's mother was diagnosed with mesothelioma lung cancer and died at age 59.  She had one sister who was diagnosed with melanoma in her 30s.  There is no other reported cancer on either side of the family.  Patient's maternal ancestors are of [place] descent, and paternal ancestors are of [place], [place] and European descent descent. There is no reported Ashkenazi Jewish ancestry. There is no  known consanguinity.  GENETIC COUNSELING RISK ASSESSMENT, DISCUSSION, AND SUGGESTED FOLLOW UP: We reviewed the natural history and genetic etiology of sporadic, familial and hereditary cancer syndromes.   About 5-10% of breast cancer is hereditary.  Of this, about 85% is the result of a BRCA1 or BRCA2 mutation.  We reviewed the red flags of hereditary cancer syndromes and the dominant inheritance patterns. The patient tested negative for BRCA mutations in the past, but has not had del/dup testing (BART).   If the BRCA testing is negative, we discussed that we could be testing for  the wrong gene.  We discussed gene panels, and that several cancer genes that are associated with different cancers can be tested at the same time.  Because of the different types of cancer that are in the patient's family, we will consider one of the panel tests if she is negative for BRCA mutations.   The patient's personal history of breast cancer is suggestive of the following possible diagnosis: hereditary cancer syndrome  We discussed that identification of a hereditary cancer syndrome may help her care providers tailor the patients medical management. If a mutation indicating a hereditary cancer syndroem is detected in this case, the [employer] recommendations would include increased cancer surveillance  and possible prophylactic surgery. If a mutation is detected, the patient will be referred back to the referring provider and to any additional appropriate care providers to discuss the relevant options.   If a mutation is not found in the patient, this will decrease the likelihood of a hereditary cancer syndrome as the explanation for her breast cancer. Cancer surveillance options would be discussed for the patient according to the appropriate standard National Comprehensive Cancer Network and American Cancer Society guidelines, with consideration of their personal and family history risk factors. In this case, the patient will be referred back to their care providers for discussions of management.   After considering the risks, benefits, and limitations, the patient provided informed consent for  the following  testing: Breast/ovarian cancer panel through [facility].   Per the patient's request, we will contact her by telephone to discuss these results. A follow up genetic counseling visit will be scheduled if indicated.  The patient was seen for a total of 60 minutes, greater than 50% of which was spent face-to-face counseling.  This plan is being carried out per Dr. Renelda Loma recommendations.  This note will also be sent to the referring provider via the electronic medical record. The patient will be supplied with a summary of this genetic counseling discussion as well as educational information on the discussed hereditary cancer syndromes following the conclusion of their visit.   Patient was discussed with Dr. Drue Second.    _______________________________________________________________________ For Office Staff:  Number of people involved in session: 3 Was an Intern/ student involved with case: yes

## 2012-02-23 ENCOUNTER — Telehealth: Payer: Self-pay | Admitting: *Deleted

## 2012-02-23 ENCOUNTER — Ambulatory Visit (HOSPITAL_BASED_OUTPATIENT_CLINIC_OR_DEPARTMENT_OTHER): Payer: 59 | Admitting: Oncology

## 2012-02-23 ENCOUNTER — Other Ambulatory Visit (HOSPITAL_BASED_OUTPATIENT_CLINIC_OR_DEPARTMENT_OTHER): Payer: 59 | Admitting: Lab

## 2012-02-23 VITALS — BP 116/73 | HR 60 | Temp 98.4°F | Resp 20 | Ht 70.0 in | Wt 183.6 lb

## 2012-02-23 DIAGNOSIS — Z170 Estrogen receptor positive status [ER+]: Secondary | ICD-10-CM

## 2012-02-23 DIAGNOSIS — C50919 Malignant neoplasm of unspecified site of unspecified female breast: Secondary | ICD-10-CM

## 2012-02-23 LAB — CBC WITH DIFFERENTIAL/PLATELET
BASO%: 0.4 % (ref 0.0–2.0)
Basophils Absolute: 0 10*3/uL (ref 0.0–0.1)
Eosinophils Absolute: 0.1 10*3/uL (ref 0.0–0.5)
HCT: 38.1 % (ref 34.8–46.6)
HGB: 12.7 g/dL (ref 11.6–15.9)
MONO#: 0.5 10*3/uL (ref 0.1–0.9)
NEUT#: 4.5 10*3/uL (ref 1.5–6.5)
NEUT%: 59.3 % (ref 38.4–76.8)
WBC: 7.7 10*3/uL (ref 3.9–10.3)
lymph#: 2.5 10*3/uL (ref 0.9–3.3)

## 2012-02-23 LAB — COMPREHENSIVE METABOLIC PANEL (CC13)
Albumin: 3.8 g/dL (ref 3.5–5.0)
Alkaline Phosphatase: 84 U/L (ref 40–150)
CO2: 27 mEq/L (ref 22–29)
Glucose: 84 mg/dl (ref 70–99)
Potassium: 3.8 mEq/L (ref 3.5–5.1)
Sodium: 141 mEq/L (ref 136–145)
Total Protein: 7.2 g/dL (ref 6.4–8.3)

## 2012-02-23 NOTE — Progress Notes (Signed)
Hematology and Oncology Follow Up Visit  Lori Cowan [id number] 22-Jun-1960 51 y.o. 02/23/2012 4:40 PM   DIAGNOSIS: HER-2 positive breast cancer diagnosed 02/07/2008, status post [facility] chemotherapy given neoadjuvant rate x6 cycles., Followed by one year of Herceptin. Pathological complete response seen both in the breast and in lymph nodes, residual DCIS with positive margin. Status post left mastectomy with TRAM flap 06/12/2008.   No diagnosis found.   PAST THERAPY: As above   Interim History:  Patient is doing well. She has no complaints. She is on minimal medications. She is due for a followup mammogram in July as well as a bone density test which has been scheduled already. Appetite good weight is stable. She denies headaches blurred vision shortness of breath or cough. She denies numbness tingling in hands or feet. 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  Medications: I have reviewed the patient's current medications.  Allergies:  Allergies  Allergen Reactions  . Tape Rash    Past Medical History, Surgical history, Social history, and Family History were reviewed and updated.  Review of Systems: Constitutional:  Negative for fever, chills, night sweats, anorexia, weight loss, pain. Cardiovascular: no chest pain or dyspnea on exertion Respiratory: negative Neurological: negative Dermatological: negative ENT: negative Skin Gastrointestinal: negative Genito-Urinary: negative Hematological and Lymphatic: negative Breast: negative Musculoskeletal: negative Remaining ROS negative.  Physical Exam:  Blood pressure 116/73, pulse 60, temperature 98.4 F (36.9 C), temperature source Oral, resp. rate 20, height 5\' 10"  (1.778 m), weight 183 lb 9.6 oz (83.28 kg).  ECOG: 0   General appearance: alert, cooperative and appears stated age Eyes: conjunctivae/corneas clear. PERRL, EOM's intact. Fundi benign. Throat: lips, mucosa, and tongue normal; teeth and gums  normal Resp: clear to auscultation bilaterally and normal percussion bilaterally Breasts: normal appearance, no masses or tenderness, left breast is status post mastectomy now with TRAM flap. No masses in either breast, both axilla negative. Cardio: regular rate and rhythm, S1, S2 normal, no murmur, click, rub or gallop and normal apical impulse Extremities: extremities normal, atraumatic, no cyanosis or edema Pulses: 2+ and symmetric Lymph nodes: Cervical, supraclavicular, and axillary nodes normal. Neurologic: Grossly normal   Lab Results: Lab Results  Component Value Date   WBC 7.7 02/23/2012   HGB 12.7 02/23/2012   HCT 38.1 02/23/2012   MCV 93.6 02/23/2012   PLT 367 02/23/2012     Chemistry      Component Value Date/Time   NA 141 02/23/2012 1550   NA 137 02/17/2011 1533   K 3.8 02/23/2012 1550   K 3.8 02/17/2011 1533   CL 107 02/23/2012 1550   CL 101 02/17/2011 1533   CO2 27 02/23/2012 1550   CO2 28 02/17/2011 1533   BUN 17.0 02/23/2012 1550   BUN 21 02/17/2011 1533   CREATININE 1.2* 02/23/2012 1550   CREATININE 1.05 02/17/2011 1533      Component Value Date/Time   CALCIUM 9.2 02/23/2012 1550   CALCIUM 9.9 02/17/2011 1533   ALKPHOS 84 02/23/2012 1550   ALKPHOS 82 02/17/2011 1533   AST 22 02/23/2012 1550   AST 20 02/17/2011 1533   ALT 20 02/23/2012 1550   ALT 17 02/17/2011 1533   BILITOT 0.33 02/23/2012 1550   BILITOT 0.4 02/17/2011 1533       Radiological Studies:  No results found.   IMPRESSIONS AND PLAN: A 51 y.o. female with   History of HER-2 positive breast cancer status post neoadjuvant therapy with complete pathological response, now on followup  in the third year of followup. Her labwork is not currently available, I have recommended she take additional vitamin D. I will see her in 6 months time. Spent more than half the time coordinating care.    Orson Rho 12/5/20134:40 PM

## 2012-02-23 NOTE — Telephone Encounter (Signed)
Made patient appointment for 07-24-2012 starting at 3:30pm

## 2012-03-12 ENCOUNTER — Telehealth: Payer: Self-pay | Admitting: Genetic Counselor

## 2012-03-12 NOTE — Telephone Encounter (Signed)
error 

## 2012-03-27 ENCOUNTER — Telehealth: Payer: Self-pay | Admitting: Genetic Counselor

## 2012-03-27 NOTE — Telephone Encounter (Signed)
Revealed BLM VUS.  We will petition for family studies.

## 2012-05-23 ENCOUNTER — Telehealth: Payer: Self-pay | Admitting: *Deleted

## 2012-05-23 NOTE — Telephone Encounter (Signed)
Called and spoke with patient to reschedule her appt. Confirmed appt. For 07/24/12 at 330.  Then will become Dr. Darnelle Catalan.

## 2012-05-28 ENCOUNTER — Encounter: Payer: Self-pay | Admitting: Oncology

## 2012-06-20 ENCOUNTER — Encounter (INDEPENDENT_AMBULATORY_CARE_PROVIDER_SITE_OTHER): Payer: Self-pay | Admitting: General Surgery

## 2012-06-20 ENCOUNTER — Ambulatory Visit (INDEPENDENT_AMBULATORY_CARE_PROVIDER_SITE_OTHER): Payer: 59 | Admitting: General Surgery

## 2012-06-20 VITALS — BP 118/72 | HR 60 | Temp 97.6°F | Resp 14 | Ht 70.0 in | Wt 184.0 lb

## 2012-06-20 DIAGNOSIS — C50919 Malignant neoplasm of unspecified site of unspecified female breast: Secondary | ICD-10-CM

## 2012-06-20 DIAGNOSIS — C50912 Malignant neoplasm of unspecified site of left female breast: Secondary | ICD-10-CM

## 2012-06-20 NOTE — Patient Instructions (Signed)
Continue regular self exams  

## 2012-07-09 NOTE — Progress Notes (Signed)
Subjective:     Patient ID: Lori Cowan, female   DOB: [birth year], 52 y.o.   MRN: [id number]  HPI The patient is a 52 year old female who is 4 years status post left mastectomy and negative sentinel node biopsy for a T1aN0 left breast cancer. She had a reconstruction done by Dr. Noelle Penner. She notes only some mild soreness in the left breast. She does regular self exams and has not noticed any new lumps. She denies any discharge from her nipple. She has otherwise been in good health since her last visit. Her last mammogram was in July that showed no evidence of malignancy. 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   HENT: Negative.   Eyes: Negative.   Respiratory: Negative.   Cardiovascular: Negative.   Gastrointestinal: Negative.   Endocrine: Negative.   Genitourinary: Negative.   Musculoskeletal: Negative.   Skin: Negative.   Allergic/Immunologic: Negative.   Neurological: Negative.   Hematological: Negative.   Psychiatric/Behavioral: Negative.        Objective:   Physical Exam  Constitutional: She is oriented to person, place, and time. She appears well-developed and well-nourished.  HENT:  Head: Normocephalic and atraumatic.  Eyes: Conjunctivae and EOM are normal. Pupils are equal, round, and reactive to light.  Neck: Normal range of motion. Neck supple.  Cardiovascular: Normal rate, regular rhythm and normal heart sounds.   Pulmonary/Chest: Effort normal and breath sounds normal.  There is no palpable mass of the left reconstructed breast. There is no palpable mass the right breast. There is no palpable axillary supraclavicular cervical lymphadenopathy.  Abdominal: Soft. Bowel sounds are normal. She exhibits no mass. There is no tenderness.  Musculoskeletal: Normal range of motion.  Lymphadenopathy:    She has no cervical adenopathy.   Neurological: She is alert and oriented to person, place, and time.  Skin: Skin is warm and dry.  Psychiatric: She has a normal mood and affect. Her behavior is normal.       Assessment:     The patient is 4 years status post left mastectomy for breast cancer     Plan:     At this point she will continue to do regular self exams. We will plan to see her back in one year.

## 2012-07-24 ENCOUNTER — Ambulatory Visit: Payer: 59 | Admitting: Oncology

## 2012-07-24 ENCOUNTER — Ambulatory Visit (HOSPITAL_BASED_OUTPATIENT_CLINIC_OR_DEPARTMENT_OTHER): Payer: 59 | Admitting: Oncology

## 2012-07-24 ENCOUNTER — Other Ambulatory Visit: Payer: 59 | Admitting: Lab

## 2012-07-24 ENCOUNTER — Telehealth: Payer: Self-pay | Admitting: *Deleted

## 2012-07-24 VITALS — BP 118/76 | HR 59 | Temp 98.5°F | Resp 20 | Ht 70.0 in | Wt 188.0 lb

## 2012-07-24 DIAGNOSIS — C50912 Malignant neoplasm of unspecified site of left female breast: Secondary | ICD-10-CM

## 2012-07-24 DIAGNOSIS — D0590 Unspecified type of carcinoma in situ of unspecified breast: Secondary | ICD-10-CM

## 2012-07-24 NOTE — Telephone Encounter (Signed)
appts made and printed...td 

## 2012-07-24 NOTE — Progress Notes (Signed)
ID: Lori Cowan   DOB: 1960-03-25  MR#: [id number]  CSN#:[id number]  PCP: Lori Rua, MD GYN: Lori Cowan SU: Lori Cowan OTHER MD: Lori Cowan  HISTORY OF PRESENT ILLNESS: From Lori Cowan's note 05/07/2008:  "She has had problems with fibrocystic disease in both breasts and has had multiple biopsies on both sides.  She had a screening mammogram on 01/04/2008, which showed a possible abnormality in the left breast.  She was referred for digital mammogram, which showed an indeterminate area of calcifications lower central left breast at 6 o'clock.  Biopsy was recommended and this showed a focus of DCIS, nuclear grade 3, with comedonecrosis.  This was ER and PR negative.  She was subsequently referred back to [facility] where she had an MRI scan on 02/29/2008.  This showed significant background parenchymal enhancement, numerous bilateral large and small cysts at 6 o'clock position of left breast, small clip artifact, and no other enhancement was seen.  Post biopsy changes seen in the left breast.  The patient elected to undergo lumpectomy and sentinel lymph node evaluation.  This took place on 04/09/2008 and ultimately this showed a focus of DCIS with associated multifocal invasive cancer.  Largest focus of invasive cancer measuring 0.4 cm.  Surgical margins were clear apart from superior margin, DCIS was seen at the superior margin.  Invasive component was grade 2/3, ER and PR negative, and HER-2 was positive with a ratio of 8.19.  Five sentinel lymph nodes were identified all of which were negative for malignancy.  The patient has had an unremarkable postoperative course"  Her subsequent history is as detailed below.  INTERVAL HISTORY: Lori Cowan returns for followup of her breast cancer. She is establishing herself in my practice today  REVIEW OF SYSTEMS: She exercises by jogging, boxing, and doing weight. She has moderate problems with hot flashes, and some blurred vision  which she feels are due to dry eyes. Overall she feels "fine". A detailed review of systems today was noncontributory.  PAST MEDICAL HISTORY: Past Medical History  Diagnosis Date  . Fatigue   . Cancer     breast - left  . Menopause   . Family history of breast cancer   . Breast cancer 2009     at 47    PAST SURGICAL HISTORY: Past Surgical History  Procedure Laterality Date  . Breast surgery      left mastectomy w/axillary dissection  . Breast surgery      tram flap reconstruction    FAMILY HISTORY Family History  Problem Relation Age of Onset  . Cancer Mother     mesothelioma  . Cancer Father 39    colon  . Hypertension Father   . Breast cancer Sister 33  . Melanoma Maternal Aunt     dx in her 30s  . Breast cancer Paternal Aunt     dx in her 4s  . Prostate cancer Paternal Uncle     dx in his 80s   the patient's father is living at age 83. He had one sister who was diagnosed with breast cancer in her 69s and one brother who was diagnosed with prostate cancer in his 78s. There were no other siblings. The patient's mother died at the age of 100 from mesothelioma. She worked in Radio broadcast assistant. The patient had no brothers, 2 sisters. Her sister was diagnosed with breast cancer in her 30s. She is BRCA negative, as is the patient.  GYNECOLOGIC HISTORY: Menarche age 47,  first live birth age 19, the patient is GX P2. She went through menopause in 2010, with her chemotherapy. She did not take hormone replacement.   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 She trained at the [employer] [place] and that her cytology fellowship at the [employer] [place] in [place]. Her husband Lori Cowan (goes by [employer]") bills raised scars. Son Lori Cowan is a Research scientist (life sciences) and is studying [place] and global studies at the [employer] [place]. Daughter Lori Cowan is studying international business at [employer]. the patient has no grandchildren. She is not a Counsellor.   ADVANCED DIRECTIVES: Not in place  HEALTH MAINTENANCE: History  Substance Use Topics  . Smoking status: Never Smoker   . Smokeless tobacco: Never Used  . Alcohol Use: Yes     Comment: socially     Colonoscopy:  PAP:  Bone density:  Lipid panel:  Allergies  Allergen Reactions  . Tape Rash    Current Outpatient Prescriptions  Medication Sig Dispense Refill  . aspirin 81 MG tablet Take 81 mg by mouth daily.      . Calcium Carbonate-Vitamin D (CALCIUM 600 + D PO) Take by mouth.      . Melatonin 3 MG CAPS Take 1 capsule by mouth at bedtime as needed. For sleep      . Multiple Vitamin (MULTIVITAMIN) tablet Take 1 tablet by mouth daily.      . Omega-3 Fatty Acids (FISH OIL) 1360 MG CAPS Take 1 capsule by mouth daily.       No current facility-administered medications for this visit.    OBJECTIVE: Middle-aged woman who appears healthy Filed Vitals:   07/24/12 1546  BP: 118/76  Pulse: 59  Temp: 98.5 F (36.9 C)  Resp: 20     Body mass index is 26.98 kg/(m^2).    ECOG FS: 0  Sclerae unicteric Oropharynx clear No cervical or supraclavicular adenopathy Lungs no rales or rhonchi Heart regular rate and rhythm Abd soft, nontender, positive bowel sounds MSK no focal spinal tenderness, no peripheral edema Neuro: nonfocal, well oriented, pleasant affect Breasts: The right breast is unremarkable. The left breast is status post mastectomy with TRAM reconstruction. There is no evidence of local recurrence. The left axilla is benign.   LAB RESULTS: Lab Results  Component Value Date   WBC 7.7 02/23/2012   NEUTROABS 4.5 02/23/2012   HGB 12.7 02/23/2012   HCT 38.1 02/23/2012   MCV 93.6 02/23/2012   PLT 367 02/23/2012      Chemistry      Component Value Date/Time   NA 141 02/23/2012 1550   NA 137 02/17/2011 1533   K 3.8 02/23/2012 1550   K 3.8 02/17/2011 1533   CL 107 02/23/2012 1550   CL 101 02/17/2011 1533   CO2 27 02/23/2012 1550   CO2 28 02/17/2011 1533   BUN  17.0 02/23/2012 1550   BUN 21 02/17/2011 1533   CREATININE 1.2* 02/23/2012 1550   CREATININE 1.05 02/17/2011 1533      Component Value Date/Time   CALCIUM 9.2 02/23/2012 1550   CALCIUM 9.9 02/17/2011 1533   ALKPHOS 84 02/23/2012 1550   ALKPHOS 82 02/17/2011 1533   AST 22 02/23/2012 1550   AST 20 02/17/2011 1533   ALT 20 02/23/2012 1550   ALT 17 02/17/2011 1533   BILITOT 0.33 02/23/2012 1550   BILITOT 0.4 02/17/2011 1533       Lab Results  Component Value Date   LABCA2  19 02/23/2012    No components found with this basename: LABCA125    No results found for this basename: INR,  in the last 168 hours  Urinalysis No results found for this basename: colorurine, appearanceur, labspec, phurine, glucoseu, hgbur, bilirubinur, ketonesur, proteinur, urobilinogen, nitrite, leukocytesur    STUDIES: No results found. Repeat mammography will be due July of 2014  ASSESSMENT: 52 y.o. 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 I am comfortable seeing her once a year and in fact I usually release from followup estrogen receptor positive patients 5 years after her there definitive surgery.  Accordingly I will see Lori Cowan of 2015. We will do lab work and physical exam at that  visit. I will also check with our genetic counselor if there has been any new information on the BLM gene variant she carries. If all continues well, I will likely release her to Dr. Izola Price care at that time.   Lori Cowan C    07/24/2012

## 2012-08-13 ENCOUNTER — Ambulatory Visit: Payer: Self-pay | Admitting: Obstetrics & Gynecology

## 2012-09-10 ENCOUNTER — Other Ambulatory Visit: Payer: Self-pay

## 2012-09-10 DIAGNOSIS — Z1231 Encounter for screening mammogram for malignant neoplasm of breast: Secondary | ICD-10-CM

## 2012-09-25 ENCOUNTER — Telehealth: Payer: Self-pay | Admitting: *Deleted

## 2012-09-25 NOTE — Telephone Encounter (Signed)
Message left by pt stating noted " mid sternal rib discomfort which is intermittant but ongoing x 1 month ". 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  Mayley reports no new injury or activity that could be cause.  Discomfort is noted with deep breathing but not with laying down or other body positions.  Return call for pt is [phone].  This note will be given to MD for review for recommendations.

## 2012-09-26 ENCOUNTER — Other Ambulatory Visit: Payer: Self-pay | Admitting: Emergency Medicine

## 2012-09-26 DIAGNOSIS — C50919 Malignant neoplasm of unspecified site of unspecified female breast: Secondary | ICD-10-CM

## 2012-09-28 ENCOUNTER — Ambulatory Visit (HOSPITAL_COMMUNITY)
Admission: RE | Admit: 2012-09-28 | Discharge: 2012-09-28 | Disposition: A | Discharge status: Home / Self Care | Payer: 59 | Source: Ambulatory Visit | Attending: Oncology | Admitting: Oncology

## 2012-09-28 DIAGNOSIS — R079 Chest pain, unspecified: Secondary | ICD-10-CM | POA: Insufficient documentation

## 2012-09-28 DIAGNOSIS — C50919 Malignant neoplasm of unspecified site of unspecified female breast: Secondary | ICD-10-CM

## 2012-09-28 DIAGNOSIS — Z853 Personal history of malignant neoplasm of breast: Secondary | ICD-10-CM | POA: Insufficient documentation

## 2012-10-02 IMAGING — US US TRANSVAGINAL NON-OB
1 series · 13 of 25 positions shown · non-contrast
Comparison: 3161

CLINICAL DATA: Postmenopausal bleeding.  Breast cancer history.



[Series 1: us pelvis complete · 13 of 52 slices shown]
[im 1/52]
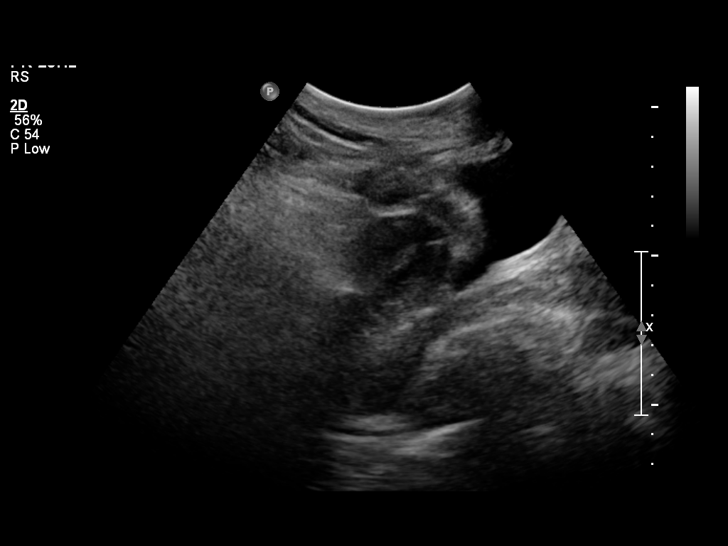
[im 5/52]
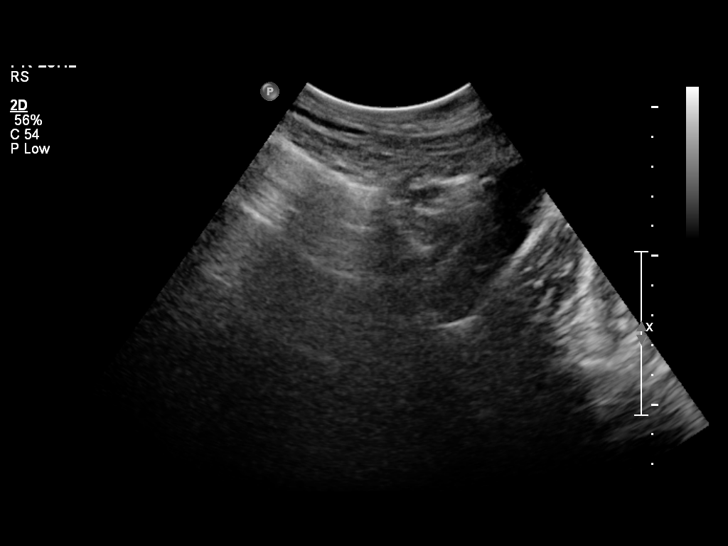
[im 9/52]
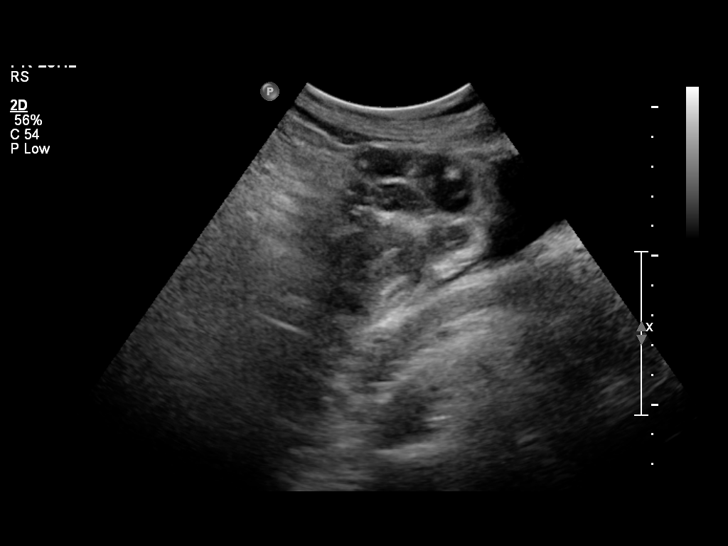
[im 13/52]
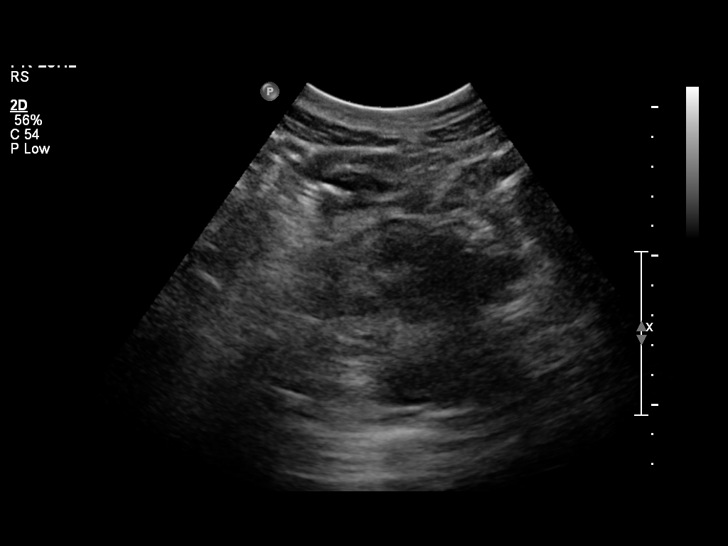
[im 18/52]
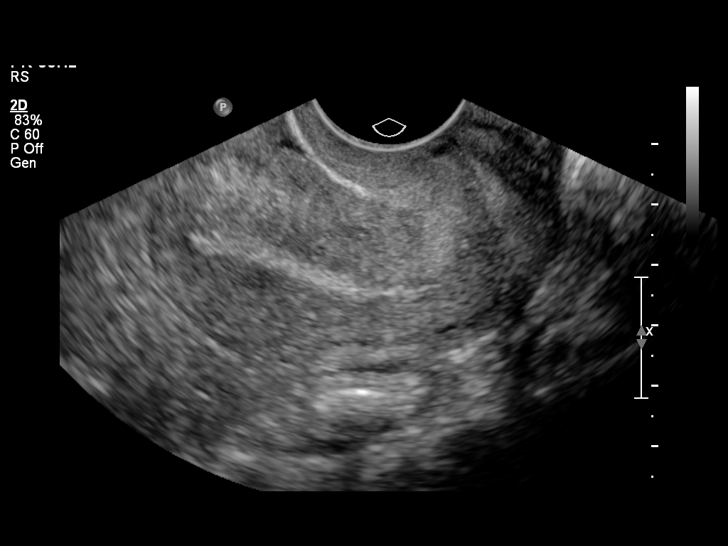
[im 22/52]
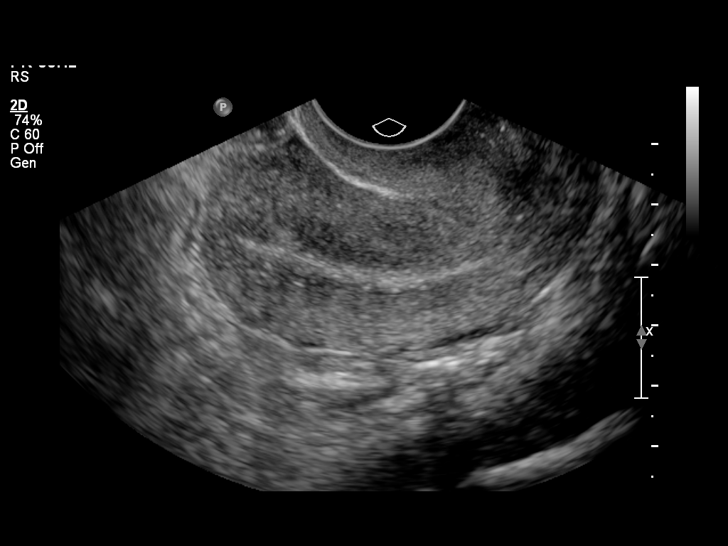
[im 26/52]
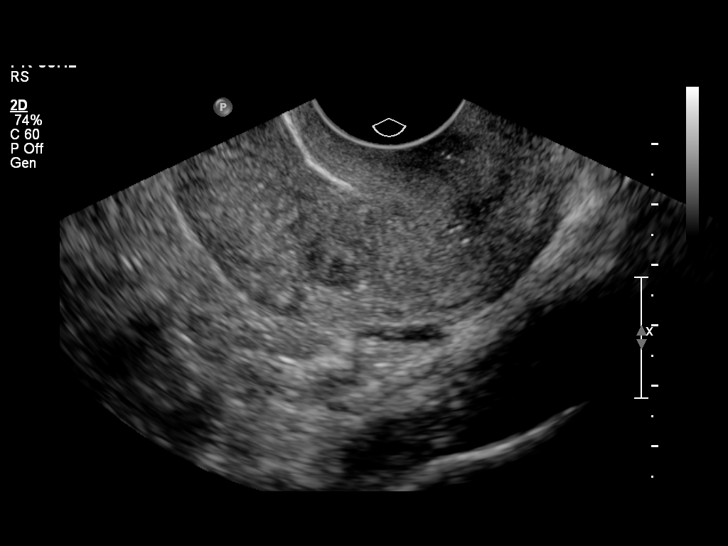
[im 30/52]
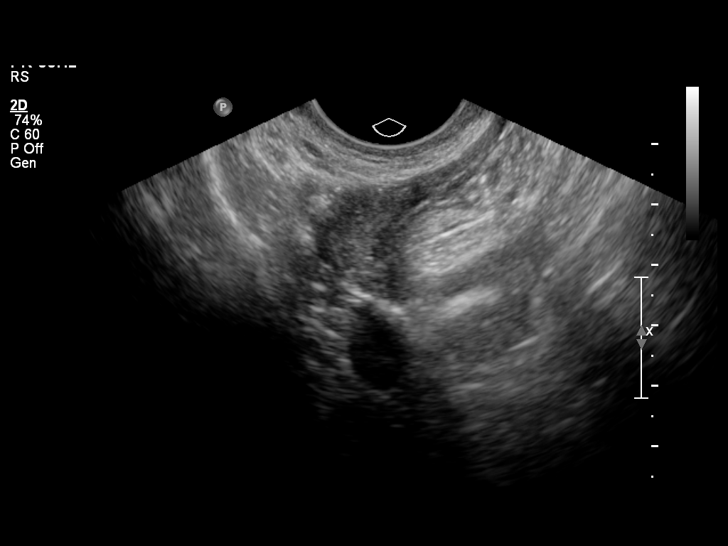
[im 35/52]
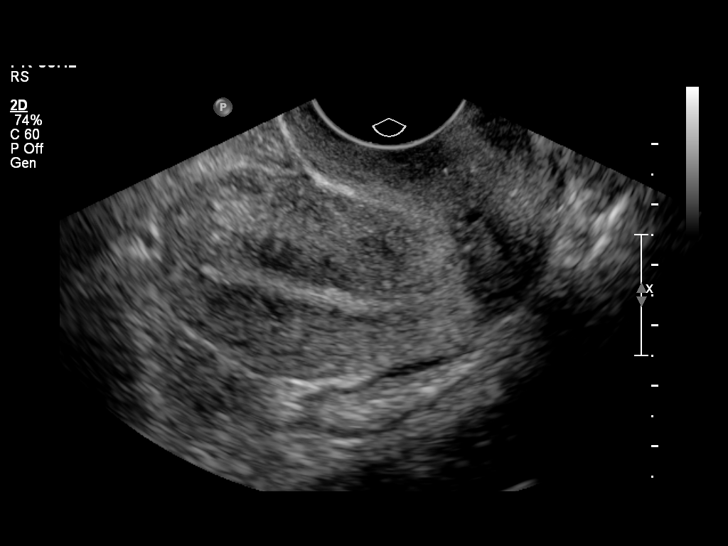
[im 39/52]
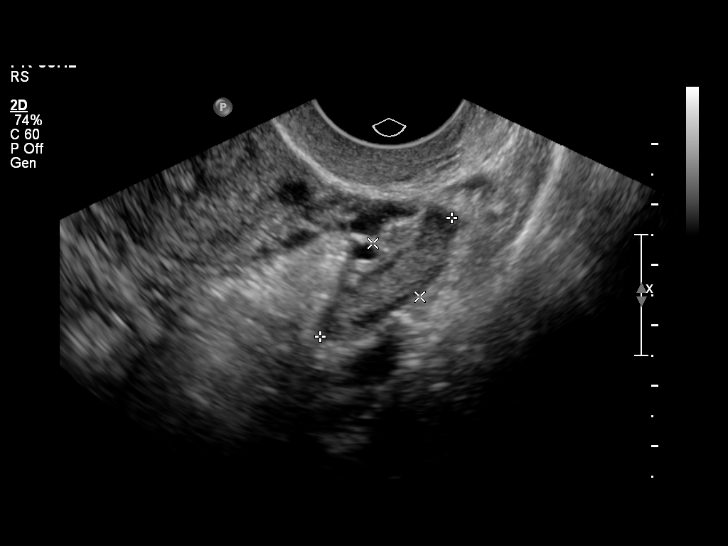
[im 43/52]
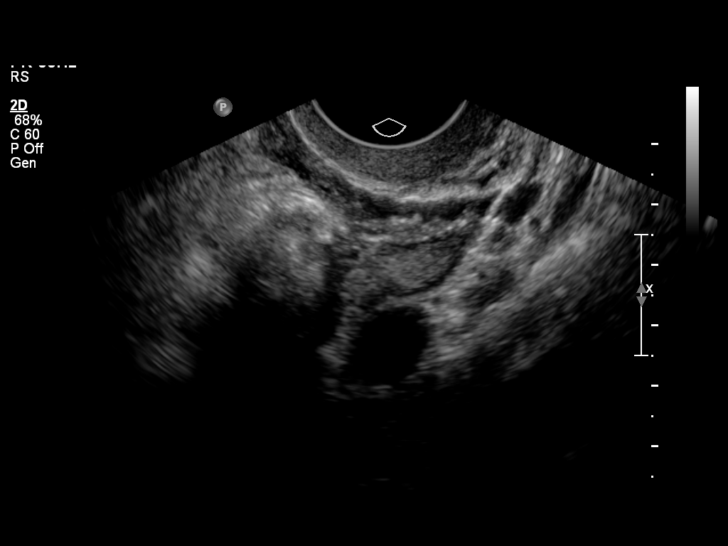
[im 47/52]
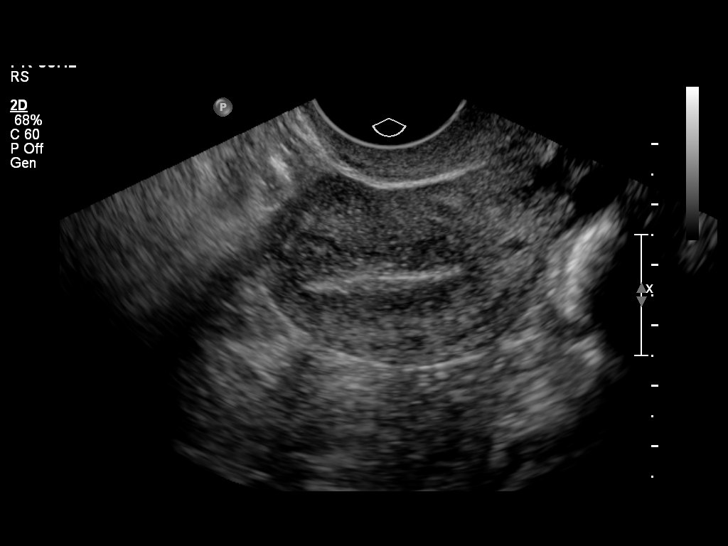
[im 52/52]
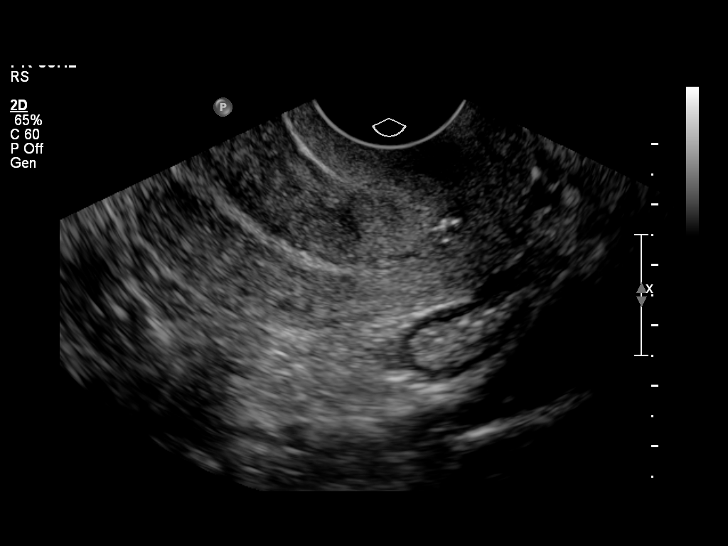

[13 of 25 positions shown; findings below may reference images not displayed]

FINDINGS: Uterus: Is anteverted and anteflexed and demonstrates a sagittal
length of 6.9 cm, depth of 3.4 cm and width of 4.5 cm.  A small
focal fibroid is identified in the right lateral lower uterine
segment measuring 1.0 x 0.8 x 1.1 cm, mural in location.  The
remainder of the myometrium is homogeneous

Endometrium: Appears thin and echogenic with a width of 4.5 mm.  No
areas of focal thickening or heterogeneity are seen and this is
within normal limits for a postmenopausal patient experiencing
vaginal bleeding and may signify the presence of atrophy.

Right ovary:  Has a normal appearance measuring 1.9 x 1.1 x 1.5 cm

Left ovary: Has a normal appearance measuring 2.9 x 1.22 x 8-0.3 cm

Other findings: No pelvic fluid or separate adnexal masses are
seen.
IMPRESSION: Small focal mural fibroid sizes location as noted above.  Otherwise
normal postmenopausal ultrasound..

## 2012-10-15 ENCOUNTER — Ambulatory Visit (INDEPENDENT_AMBULATORY_CARE_PROVIDER_SITE_OTHER): Payer: 59 | Admitting: Obstetrics & Gynecology

## 2012-10-15 ENCOUNTER — Encounter: Payer: Self-pay | Admitting: Obstetrics & Gynecology

## 2012-10-15 VITALS — BP 108/73 | HR 60 | Temp 97.8°F | Ht 70.0 in | Wt 189.8 lb

## 2012-10-15 DIAGNOSIS — Z5320 Procedure and treatment not carried out because of patient's decision for unspecified reasons: Secondary | ICD-10-CM

## 2012-10-15 NOTE — Progress Notes (Deleted)
.   Subjective:     Lori Cowan is a 52 y.o. female here for a routine exam.  Current complaints: ***.  Personal health questionnaire reviewed: {yes/no:9010}.   Gynecologic History No LMP recorded. Patient is postmenopausal. Contraception: {method:5051} Last Pap: ***. Results were: {norm/abn:16337} Last mammogram: ***. Results were: {norm/abn:16337}  Obstetric History OB History   Grav Para Term Preterm Abortions TAB SAB Ect Mult Living                   {Common ambulatory SmartLinks:19316}  Review of Systems {ros; complete:30496}    Objective:    {exam; complete:18323}    Assessment:    Healthy female exam.    Plan:    {plan:19193}

## 2012-10-17 ENCOUNTER — Ambulatory Visit: Payer: 59

## 2012-11-06 ENCOUNTER — Ambulatory Visit
Admission: RE | Admit: 2012-11-06 | Discharge: 2012-11-06 | Disposition: A | Discharge status: Home / Self Care | Payer: 59 | Source: Ambulatory Visit

## 2012-11-06 DIAGNOSIS — Z1231 Encounter for screening mammogram for malignant neoplasm of breast: Secondary | ICD-10-CM

## 2012-12-03 NOTE — Progress Notes (Signed)
Pt rescheduled appointment.

## 2013-04-22 ENCOUNTER — Other Ambulatory Visit (HOSPITAL_BASED_OUTPATIENT_CLINIC_OR_DEPARTMENT_OTHER): Payer: 59

## 2013-04-22 DIAGNOSIS — C50419 Malignant neoplasm of upper-outer quadrant of unspecified female breast: Secondary | ICD-10-CM

## 2013-04-22 DIAGNOSIS — C50912 Malignant neoplasm of unspecified site of left female breast: Secondary | ICD-10-CM

## 2013-04-22 LAB — COMPREHENSIVE METABOLIC PANEL (CC13)
ALT: 23 U/L (ref 0–55)
ANION GAP: 11 meq/L (ref 3–11)
AST: 33 U/L (ref 5–34)
Albumin: 3.6 g/dL (ref 3.5–5.0)
Alkaline Phosphatase: 68 U/L (ref 40–150)
BILIRUBIN TOTAL: 0.44 mg/dL (ref 0.20–1.20)
BUN: 12.6 mg/dL (ref 7.0–26.0)
CALCIUM: 9.2 mg/dL (ref 8.4–10.4)
CHLORIDE: 109 meq/L (ref 98–109)
CO2: 24 meq/L (ref 22–29)
CREATININE: 0.9 mg/dL (ref 0.6–1.1)
GLUCOSE: 86 mg/dL (ref 70–140)
Potassium: 4.3 mEq/L (ref 3.5–5.1)
Sodium: 144 mEq/L (ref 136–145)
Total Protein: 6.4 g/dL (ref 6.4–8.3)

## 2013-04-22 LAB — CBC WITH DIFFERENTIAL/PLATELET
BASO%: 0.3 % (ref 0.0–2.0)
BASOS ABS: 0 10*3/uL (ref 0.0–0.1)
EOS%: 2.5 % (ref 0.0–7.0)
Eosinophils Absolute: 0.2 10*3/uL (ref 0.0–0.5)
HEMATOCRIT: 36.9 % (ref 34.8–46.6)
HEMOGLOBIN: 12.5 g/dL (ref 11.6–15.9)
LYMPH#: 3 10*3/uL (ref 0.9–3.3)
LYMPH%: 43.4 % (ref 14.0–49.7)
MCH: 31.4 pg (ref 25.1–34.0)
MCHC: 33.9 g/dL (ref 31.5–36.0)
MCV: 92.5 fL (ref 79.5–101.0)
MONO#: 0.5 10*3/uL (ref 0.1–0.9)
MONO%: 6.8 % (ref 0.0–14.0)
NEUT#: 3.2 10*3/uL (ref 1.5–6.5)
NEUT%: 47 % (ref 38.4–76.8)
PLATELETS: 348 10*3/uL (ref 145–400)
RBC: 3.99 10*6/uL (ref 3.70–5.45)
RDW: 12.6 % (ref 11.2–14.5)
WBC: 6.9 10*3/uL (ref 3.9–10.3)

## 2013-04-29 ENCOUNTER — Ambulatory Visit (HOSPITAL_BASED_OUTPATIENT_CLINIC_OR_DEPARTMENT_OTHER): Payer: 59 | Admitting: Oncology

## 2013-04-29 VITALS — BP 116/70 | HR 73 | Temp 99.4°F | Resp 20 | Ht 70.0 in | Wt 194.6 lb

## 2013-04-29 DIAGNOSIS — C50912 Malignant neoplasm of unspecified site of left female breast: Secondary | ICD-10-CM | POA: Insufficient documentation

## 2013-04-29 DIAGNOSIS — Z853 Personal history of malignant neoplasm of breast: Secondary | ICD-10-CM

## 2013-04-29 NOTE — Progress Notes (Signed)
ID: Lori Cowan   DOB: 1960/10/20  MR#: [id number]  CSN#:[id number]  PCP: [name], MD GYN: [name] SU: Star Age OTHER MD: [name]  HISTORY OF PRESENT ILLNESS: From Dr. Collier [name]'s note 05/07/2008:  "She has had problems with fibrocystic disease in both breasts and has had multiple biopsies on both sides.  She had a screening mammogram on 01/04/2008, which showed a possible abnormality in the left breast.  She was referred for digital mammogram, which showed an indeterminate area of calcifications lower central left breast at 6 o'clock.  Biopsy was recommended and this showed a focus of DCIS, nuclear grade 3, with comedonecrosis.  This was ER and PR negative.  She was subsequently referred back to [facility] where she had an MRI scan on 02/29/2008.  This showed significant background parenchymal enhancement, numerous bilateral large and small cysts at 6 o'clock position of left breast, small clip artifact, and no other enhancement was seen.  Post biopsy changes seen in the left breast.  The patient elected to undergo lumpectomy and sentinel lymph node evaluation.  This took place on 04/09/2008 and ultimately this showed a focus of DCIS with associated multifocal invasive cancer.  Largest focus of invasive cancer measuring 0.4 cm.  Surgical margins were clear apart from superior margin, DCIS was seen at the superior margin.  Invasive component was grade 2/3, ER and PR negative, and HER-2 was positive with a ratio of 8.19.  Five sentinel lymph nodes were identified all of which were negative for malignancy.  The patient has had an unremarkable postoperative course"  Her subsequent history is as detailed below.  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 The interval history is unremarkable. 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 Unfortunately she had a cousin who died from lung cancer only 14 weeks after her diagnosis.  He was a smoker  REVIEW OF SYSTEMS: Gen. mild heartburn, possibly related to taking and, Fritz Pickerel also recently. She has a little bit of presbycusis. She is exercising regularly with her trainer and by running. Overall a detailed review of systems today was entirely benign.  PAST MEDICAL HISTORY: Past Medical History  Diagnosis Date  . Fatigue   . Cancer     breast - left  . Menopause   . Family history of breast cancer   . Breast cancer 2009     at 63    PAST SURGICAL HISTORY: Past Surgical History  Procedure Laterality Date  . Breast surgery      left mastectomy w/axillary dissection  . Breast surgery      tram flap reconstruction    FAMILY HISTORY Family History  Problem Relation Age of Onset  . Cancer Mother     mesothelioma  . Cancer Father 81    colon  . Hypertension Father   . Breast cancer Sister 75  . Melanoma Maternal Aunt     dx in her 52s  . Breast cancer Paternal Aunt     dx in her 4s  . Prostate cancer Paternal Uncle     dx in his 12s   the patient's father is living at age 78. He had one sister who was diagnosed with breast cancer in her 63s and one brother who was diagnosed with prostate cancer in his 80s. There were no other siblings. The patient's mother died at the age of 67 from mesothelioma. She worked in Chartered loss adjuster. The patient had no  brothers, 2 sisters. Her sister was diagnosed with breast cancer in her 35s. She is BRCA negative, as is the patient.  GYNECOLOGIC HISTORY: Menarche age 38, first live birth age 13, the patient is [name]. She went through menopause in 2010, with her chemotherapy. She did not take hormone replacement.   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 She trained at the [employer] and that her cytology fellowship at the [employer] in [place]. Her husband Sripathi Haputantri (goes by [name]") builds racecars. Son [name] is a Actor and is studying [place] and global studies  at the [employer]. Daughter [name] is studying international business at [employer]. The patient has no grandchildren. She is not a Ambulance person.   ADVANCED DIRECTIVES: Not in place  HEALTH MAINTENANCE: History  Substance Use Topics  . Smoking status: Never Smoker   . Smokeless tobacco: Never Used  . Alcohol Use: Yes     Comment: socially     Colonoscopy:  PAP:  Bone density:  Lipid panel:  Allergies  Allergen Reactions  . Tape Rash    Current Outpatient Prescriptions  Medication Sig Dispense Refill  . aspirin 81 MG tablet Take 81 mg by mouth daily.      . Calcium Carbonate-Vitamin D (CALCIUM 600 + D PO) Take by mouth.      . Melatonin 3 MG CAPS Take 1 capsule by mouth at bedtime as needed. For sleep      . Multiple Vitamin (MULTIVITAMIN) tablet Take 1 tablet by mouth daily.      . Omega-3 Fatty Acids (FISH OIL) 1360 MG CAPS Take 1 capsule by mouth daily.       No current facility-administered medications for this visit.    OBJECTIVE: Middle-aged woman in no acute distress Filed Vitals:   04/29/13 0916  BP: 116/70  Pulse: 73  Temp: 99.4 F (37.4 C)  Resp: 20     Body mass index is 27.92 kg/(m^2).    ECOG FS: 0  Sclerae unicteric, pupils equal and round Oropharynx clear and moist No cervical or supraclavicular adenopathy Lungs no rales or rhonchi Heart regular rate and rhythm Abd soft, nontender, positive bowel sounds MSK no focal spinal tenderness, no peripheral edema Neuro: nonfocal, well oriented, pleasant affect Breasts: The right breast is unremarkable. The left breast is status post mastectomy with TRAM reconstruction. There is no evidence of local recurrence. The left axilla is benign.   LAB RESULTS: Lab Results  Component Value Date   WBC 6.9 04/22/2013   NEUTROABS 3.2 04/22/2013   HGB 12.5 04/22/2013   HCT 36.9 04/22/2013   MCV 92.5 04/22/2013   PLT 348 04/22/2013      Chemistry      Component Value Date/Time   NA 144 04/22/2013 0817   NA 137  02/17/2011 1533   K 4.3 04/22/2013 0817   K 3.8 02/17/2011 1533   CL 107 02/23/2012 1550   CL 101 02/17/2011 1533   CO2 24 04/22/2013 0817   CO2 28 02/17/2011 1533   BUN 12.6 04/22/2013 0817   BUN 21 02/17/2011 1533   CREATININE 0.9 04/22/2013 0817   CREATININE 1.05 02/17/2011 1533      Component Value Date/Time   CALCIUM 9.2 04/22/2013 0817   CALCIUM 9.9 02/17/2011 1533   ALKPHOS 68 04/22/2013 0817   ALKPHOS 82 02/17/2011 1533   AST 33 04/22/2013 0817   AST 20 02/17/2011 1533   ALT 23 04/22/2013 0817  ALT 17 02/17/2011 1533   BILITOT 0.44 04/22/2013 0817   BILITOT 0.4 02/17/2011 1533       Lab Results  Component Value Date   LABCA2 19 02/23/2012    No components found with this basename: LABCA125    No results found for this basename: INR,  in the last 168 hours  Urinalysis No results found for this basename: colorurine,  appearanceur,  labspec,  phurine,  glucoseu,  hgbur,  bilirubinur,  ketonesur,  proteinur,  urobilinogen,  nitrite,  leukocytesur    STUDIES: RIGHT DIGITAL SCREENING MAMMOGRAM WITH CAD 11/06/2012 DIGITAL BREAST TOMOSYNTHESIS  Digital breast tomosynthesis images are acquired in two  projections. These images are reviewed in combination with the  digital mammogram, confirming the findings below.  Comparison: None.  FINDINGS:  ACR Breast Density Category: c: The breast is heterogeneously  dense, which may obscure small masses.  The patient has had a left mastectomy. No suspicious masses,  architectural distortion, or calcifications are present.  Images were processed with CAD.  IMPRESSION:  No evidence of malignancy. Screening mammography is recommended in  one year.  RECOMMENDATION:  Screening mammogram in one year. (Code:[id number]-R-01Y)   ASSESSMENT: 53 y.o. 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 She understands she has an excellent prognosis overall and is almost certainly cured from her breast cancer. We will be glad to see her at any point in the future if the need arises, but as of now we are making no further routine appointments for her here.  Cristian Grieves C    04/29/2013

## 2013-04-30 ENCOUNTER — Telehealth: Payer: Self-pay | Admitting: Oncology

## 2013-04-30 NOTE — Telephone Encounter (Signed)
Sent letter to Fr. Crosby office from Dr. Jana Hakim.

## 2013-07-08 ENCOUNTER — Ambulatory Visit (INDEPENDENT_AMBULATORY_CARE_PROVIDER_SITE_OTHER): Payer: Self-pay | Admitting: General Surgery

## 2013-07-09 ENCOUNTER — Ambulatory Visit (INDEPENDENT_AMBULATORY_CARE_PROVIDER_SITE_OTHER): Payer: Self-pay | Admitting: General Surgery

## 2013-07-15 ENCOUNTER — Ambulatory Visit (INDEPENDENT_AMBULATORY_CARE_PROVIDER_SITE_OTHER): Payer: 59 | Admitting: General Surgery

## 2013-07-15 ENCOUNTER — Encounter (INDEPENDENT_AMBULATORY_CARE_PROVIDER_SITE_OTHER): Payer: Self-pay | Admitting: General Surgery

## 2013-07-15 VITALS — BP 122/78 | HR 74 | Temp 98.4°F | Resp 16 | Ht 70.0 in | Wt 186.6 lb

## 2013-07-15 DIAGNOSIS — C50919 Malignant neoplasm of unspecified site of unspecified female breast: Secondary | ICD-10-CM

## 2013-07-15 DIAGNOSIS — C50912 Malignant neoplasm of unspecified site of left female breast: Secondary | ICD-10-CM

## 2013-07-15 NOTE — Progress Notes (Signed)
Subjective:     Patient ID: Lori Cowan, female   DOB: [birth year], 53 y.o.   MRN: [id number]  HPI The patient is a 53 year old female who is 5 years out from a left mastectomy and negative sentinel node biopsy for a T1 A. N0 left breast cancer. She had reconstruction by Dr. Denese Killings. She has done well since her last visit. She has no complaints today.  Review of Systems  Constitutional: Negative.   HENT: Negative.   Eyes: Negative.   Respiratory: Negative.   Cardiovascular: Negative.   Gastrointestinal: Negative.   Endocrine: Negative.   Genitourinary: Negative.   Musculoskeletal: Negative.   Skin: Negative.   Allergic/Immunologic: Negative.   Neurological: Negative.   Hematological: Negative.   Psychiatric/Behavioral: Negative.        Objective:   Physical Exam  Constitutional: She is oriented to person, place, and time. She appears well-developed and well-nourished.  HENT:  Head: Normocephalic and atraumatic.  Eyes: Conjunctivae and EOM are normal. Pupils are equal, round, and reactive to light.  Neck: Normal range of motion. Neck supple.  Cardiovascular: Normal rate, regular rhythm and normal heart sounds.   Pulmonary/Chest: Effort normal and breath sounds normal.  There is no palpable mass in the left reconstructed breast. There is no palpable mass in the right breast. There is no palpable axillary, supraclavicular, or cervical lymphadenopathy.  Abdominal: Soft. Bowel sounds are normal.  Musculoskeletal: Normal range of motion.  Lymphadenopathy:    She has no cervical adenopathy.  Neurological: She is alert and oriented to person, place, and time.  Skin: Skin is warm and dry.  Psychiatric: She has a normal mood and affect. Her behavior is normal.       Assessment:     The patient is 5 years status post left mastectomy for breast cancer with reconstruction     Plan:     At this point she will continue to do regular self exams. I will plan to see her back in one  year.

## 2013-07-15 NOTE — Patient Instructions (Signed)
Continue regular self exams  

## 2013-08-20 ENCOUNTER — Ambulatory Visit: Payer: 59 | Admitting: Advanced Practice Midwife

## 2013-10-09 ENCOUNTER — Other Ambulatory Visit: Payer: Self-pay

## 2013-10-09 DIAGNOSIS — Z9012 Acquired absence of left breast and nipple: Secondary | ICD-10-CM

## 2013-10-09 DIAGNOSIS — Z853 Personal history of malignant neoplasm of breast: Secondary | ICD-10-CM

## 2013-11-07 ENCOUNTER — Ambulatory Visit
Admission: RE | Admit: 2013-11-07 | Discharge: 2013-11-07 | Disposition: A | Discharge status: Home / Self Care | Payer: 59 | Source: Ambulatory Visit

## 2013-11-07 ENCOUNTER — Other Ambulatory Visit: Payer: Self-pay

## 2013-11-07 DIAGNOSIS — Z853 Personal history of malignant neoplasm of breast: Secondary | ICD-10-CM

## 2013-11-07 DIAGNOSIS — Z9012 Acquired absence of left breast and nipple: Secondary | ICD-10-CM

## 2014-01-29 ENCOUNTER — Ambulatory Visit (INDEPENDENT_AMBULATORY_CARE_PROVIDER_SITE_OTHER): Payer: 59 | Admitting: Psychology

## 2014-01-29 DIAGNOSIS — F4323 Adjustment disorder with mixed anxiety and depressed mood: Secondary | ICD-10-CM

## 2014-02-05 ENCOUNTER — Ambulatory Visit (INDEPENDENT_AMBULATORY_CARE_PROVIDER_SITE_OTHER): Payer: 59 | Admitting: Psychology

## 2014-02-05 DIAGNOSIS — F4323 Adjustment disorder with mixed anxiety and depressed mood: Secondary | ICD-10-CM

## 2014-02-19 ENCOUNTER — Ambulatory Visit (INDEPENDENT_AMBULATORY_CARE_PROVIDER_SITE_OTHER): Payer: 59 | Admitting: Psychology

## 2014-02-19 DIAGNOSIS — F4323 Adjustment disorder with mixed anxiety and depressed mood: Secondary | ICD-10-CM

## 2014-03-12 ENCOUNTER — Ambulatory Visit (INDEPENDENT_AMBULATORY_CARE_PROVIDER_SITE_OTHER): Payer: 59 | Admitting: Psychology

## 2014-03-12 DIAGNOSIS — F4323 Adjustment disorder with mixed anxiety and depressed mood: Secondary | ICD-10-CM

## 2014-04-11 ENCOUNTER — Ambulatory Visit: Payer: 59 | Admitting: Psychology

## 2014-04-14 ENCOUNTER — Ambulatory Visit (INDEPENDENT_AMBULATORY_CARE_PROVIDER_SITE_OTHER): Payer: 59 | Admitting: Psychology

## 2014-04-14 DIAGNOSIS — F4323 Adjustment disorder with mixed anxiety and depressed mood: Secondary | ICD-10-CM

## 2014-05-14 ENCOUNTER — Ambulatory Visit: Payer: 59 | Admitting: Psychology

## 2014-05-19 ENCOUNTER — Ambulatory Visit (INDEPENDENT_AMBULATORY_CARE_PROVIDER_SITE_OTHER): Payer: 59 | Admitting: Psychology

## 2014-05-19 DIAGNOSIS — F4323 Adjustment disorder with mixed anxiety and depressed mood: Secondary | ICD-10-CM

## 2014-05-29 ENCOUNTER — Telehealth: Payer: Self-pay

## 2014-05-29 NOTE — Telephone Encounter (Signed)
FAXED PATIENT'S MEDICAL RECORDS PER PATIENT REQUEST TO [facility] AT [phone] - ALL OF THE RECORDS WERE IN OUR [facility] [place]

## 2014-06-11 ENCOUNTER — Ambulatory Visit (INDEPENDENT_AMBULATORY_CARE_PROVIDER_SITE_OTHER): Payer: 59 | Admitting: Psychology

## 2014-06-11 DIAGNOSIS — F4323 Adjustment disorder with mixed anxiety and depressed mood: Secondary | ICD-10-CM | POA: Diagnosis not present

## 2014-06-12 ENCOUNTER — Other Ambulatory Visit (HOSPITAL_COMMUNITY)
Admission: RE | Admit: 2014-06-12 | Discharge: 2014-06-12 | Disposition: A | Discharge status: Home / Self Care | Payer: 59 | Source: Ambulatory Visit | Attending: Obstetrics and Gynecology | Admitting: Obstetrics and Gynecology

## 2014-06-12 ENCOUNTER — Other Ambulatory Visit: Payer: Self-pay | Admitting: Obstetrics and Gynecology

## 2014-06-12 DIAGNOSIS — Z1151 Encounter for screening for human papillomavirus (HPV): Secondary | ICD-10-CM | POA: Insufficient documentation

## 2014-06-12 DIAGNOSIS — Z01419 Encounter for gynecological examination (general) (routine) without abnormal findings: Secondary | ICD-10-CM | POA: Diagnosis present

## 2014-06-16 LAB — CYTOLOGY - PAP

## 2014-06-25 ENCOUNTER — Ambulatory Visit (INDEPENDENT_AMBULATORY_CARE_PROVIDER_SITE_OTHER): Payer: 59 | Admitting: Psychology

## 2014-06-25 DIAGNOSIS — F4323 Adjustment disorder with mixed anxiety and depressed mood: Secondary | ICD-10-CM | POA: Diagnosis not present

## 2014-07-11 ENCOUNTER — Ambulatory Visit (INDEPENDENT_AMBULATORY_CARE_PROVIDER_SITE_OTHER): Payer: 59 | Admitting: Psychology

## 2014-07-11 DIAGNOSIS — F4323 Adjustment disorder with mixed anxiety and depressed mood: Secondary | ICD-10-CM

## 2014-07-25 ENCOUNTER — Ambulatory Visit (INDEPENDENT_AMBULATORY_CARE_PROVIDER_SITE_OTHER): Payer: 59 | Admitting: Psychology

## 2014-07-25 DIAGNOSIS — F4323 Adjustment disorder with mixed anxiety and depressed mood: Secondary | ICD-10-CM

## 2014-08-13 ENCOUNTER — Ambulatory Visit (INDEPENDENT_AMBULATORY_CARE_PROVIDER_SITE_OTHER): Payer: 59 | Admitting: Psychology

## 2014-08-13 DIAGNOSIS — F4323 Adjustment disorder with mixed anxiety and depressed mood: Secondary | ICD-10-CM

## 2014-08-27 ENCOUNTER — Ambulatory Visit (INDEPENDENT_AMBULATORY_CARE_PROVIDER_SITE_OTHER): Payer: 59 | Admitting: Psychology

## 2014-08-27 DIAGNOSIS — F4323 Adjustment disorder with mixed anxiety and depressed mood: Secondary | ICD-10-CM | POA: Diagnosis not present

## 2014-09-17 ENCOUNTER — Ambulatory Visit (INDEPENDENT_AMBULATORY_CARE_PROVIDER_SITE_OTHER): Payer: 59 | Admitting: Psychology

## 2014-09-17 DIAGNOSIS — F4323 Adjustment disorder with mixed anxiety and depressed mood: Secondary | ICD-10-CM

## 2014-09-30 ENCOUNTER — Encounter: Payer: Self-pay | Admitting: Genetic Counselor

## 2014-10-06 ENCOUNTER — Ambulatory Visit: Payer: 59 | Admitting: Psychology

## 2014-10-09 ENCOUNTER — Telehealth: Payer: Self-pay | Admitting: Genetic Counselor

## 2014-10-09 ENCOUNTER — Other Ambulatory Visit: Payer: Self-pay

## 2014-10-09 DIAGNOSIS — Z1231 Encounter for screening mammogram for malignant neoplasm of breast: Secondary | ICD-10-CM

## 2014-10-09 DIAGNOSIS — Z853 Personal history of malignant neoplasm of breast: Secondary | ICD-10-CM

## 2014-10-09 NOTE — Telephone Encounter (Signed)
Patient called to schedule genetic appt due to receiving letter in mail  08/08 @ 9 w/Karen Florene Glen

## 2014-10-31 ENCOUNTER — Telehealth: Payer: Self-pay | Admitting: Genetic Counselor

## 2014-10-31 NOTE — Telephone Encounter (Signed)
Patient called and LM on my VM stating to please cancel her apointment, as there is no further testing needed.

## 2014-10-31 NOTE — Telephone Encounter (Signed)
Called patient to clarify why she was coming in on Monday since we had performed genetic testing in 12/2011. She reported that she received a letter stating that there was updated testing.  Discussed this letter and that her name was probably missed when we consolidated databases.  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  Lab confirmed that it was.  Looked in clin var to see if other labs have seen this and what they are saying - there is inconsistency with the reports. [facility] calls this variant a benign variant.  Called patient back to let her know this information and left message.  Told her to please call back and confirm if she wanted to cancel Monday's appointment.

## 2014-11-03 ENCOUNTER — Encounter: Payer: Self-pay | Admitting: Genetic Counselor

## 2014-11-03 ENCOUNTER — Other Ambulatory Visit: Payer: Self-pay

## 2014-11-12 ENCOUNTER — Ambulatory Visit
Admission: RE | Admit: 2014-11-12 | Discharge: 2014-11-12 | Disposition: A | Discharge status: Home / Self Care | Payer: 59 | Source: Ambulatory Visit

## 2014-11-12 DIAGNOSIS — Z853 Personal history of malignant neoplasm of breast: Secondary | ICD-10-CM

## 2014-11-12 DIAGNOSIS — Z1231 Encounter for screening mammogram for malignant neoplasm of breast: Secondary | ICD-10-CM

## 2015-10-02 ENCOUNTER — Encounter: Payer: Self-pay | Admitting: Genetic Counselor

## 2015-10-12 ENCOUNTER — Other Ambulatory Visit: Payer: Self-pay | Admitting: General Surgery

## 2015-10-12 DIAGNOSIS — Z1231 Encounter for screening mammogram for malignant neoplasm of breast: Secondary | ICD-10-CM

## 2015-11-16 ENCOUNTER — Telehealth: Payer: Self-pay | Admitting: Genetic Counselor

## 2015-11-16 ENCOUNTER — Ambulatory Visit
Admission: RE | Admit: 2015-11-16 | Discharge: 2015-11-16 | Disposition: A | Discharge status: Home / Self Care | Payer: 59 | Source: Ambulatory Visit | Attending: General Surgery | Admitting: General Surgery

## 2015-11-16 ENCOUNTER — Encounter: Payer: Self-pay | Admitting: Genetic Counselor

## 2015-11-16 DIAGNOSIS — Z1231 Encounter for screening mammogram for malignant neoplasm of breast: Secondary | ICD-10-CM

## 2015-11-16 NOTE — Telephone Encounter (Signed)
Called [name] to let her know that she does not need to come back for genetic counseling and testing.  She received a recall letter in error, and she has already had comprehensive testing.  She understands.  I am cancelling her appt.  She is welcome to call with any questions she may have.

## 2015-11-17 ENCOUNTER — Other Ambulatory Visit: Payer: Self-pay | Admitting: General Surgery

## 2015-11-17 ENCOUNTER — Other Ambulatory Visit: Payer: Self-pay

## 2015-11-17 ENCOUNTER — Encounter: Payer: Self-pay | Admitting: Genetic Counselor

## 2015-11-17 DIAGNOSIS — R928 Other abnormal and inconclusive findings on diagnostic imaging of breast: Secondary | ICD-10-CM

## 2015-11-20 ENCOUNTER — Ambulatory Visit
Admission: RE | Admit: 2015-11-20 | Discharge: 2015-11-20 | Disposition: A | Discharge status: Home / Self Care | Payer: Self-pay | Source: Ambulatory Visit | Attending: General Surgery | Admitting: General Surgery

## 2015-11-20 DIAGNOSIS — R928 Other abnormal and inconclusive findings on diagnostic imaging of breast: Secondary | ICD-10-CM

## 2016-10-12 ENCOUNTER — Telehealth: Payer: Self-pay

## 2016-10-12 NOTE — Telephone Encounter (Signed)
SENT NOTES TO SCHEDULING 

## 2016-10-16 NOTE — Progress Notes (Signed)
Cardiology Office Note   Date:  10/17/2016   ID:  Lori, Cowan August 31, 1960, MRN [id number]  PCP:  Lori Ruff, MD  Cardiologist:   Jenkins Rouge, MD   No chief complaint on file.     History of Present Illness: Lori Cowan is a 56 y.o. female who presents for consultation regarding palpitations Referred by Dr Drema Dallas from [place]. CRF;s elevated lipids. Has had normal CBC TSH at work last Fall per patient Seen in office 09/28/16 after coming back from beach. Throbbing sensation in mid chest and palpitations. Some associated fatigue ? Dehydrated.   She seems to complain about malaise and decreased exercise tolerance more than anything She indicated normal echo 10 years ago when she was on herceptin for her  Breast cancer Palpitations have largely resolved.   She is half [place] and have [place] goes back to [place] often to see family Does cross fit most days / week Only one cup coffee a day no drugs or smoking     Past Medical History:  Diagnosis Date  . Breast cancer ([place]) 2009    at 68  . Cancer [facility])    breast - left  . Family history of breast cancer   . Fatigue   . Menopause     Past Surgical History:  Procedure Laterality Date  . BREAST SURGERY     left mastectomy w/axillary dissection  . BREAST SURGERY     tram flap reconstruction     Current Outpatient Prescriptions  Medication Sig Dispense Refill  . aspirin 81 MG tablet Take 81 mg by mouth daily.    . Calcium Carbonate-Vitamin D (CALCIUM 600 + D PO) Take 2 tablets by mouth.     . Melatonin 3 MG CAPS Take 1 capsule by mouth at bedtime as needed. For sleep    . Multiple Vitamin (MULTIVITAMIN) tablet Take 1 tablet by mouth daily.    . Omega-3 Fatty Acids (FISH OIL) 1360 MG CAPS Take 980 mg by mouth daily.      No current facility-administered medications for this visit.     Allergies:   Tape    Social History:  The patient  reports that she has never smoked. She has never  used smokeless tobacco. She reports that she drinks alcohol. She reports that she does not use drugs.   Family History:  The patient's family history includes Breast cancer in her paternal aunt; Breast cancer (age of onset: 42) in her sister; Cancer in her mother; Cancer (age of onset: 24) in her father; Hypertension in her father; Melanoma in her maternal aunt; Prostate cancer in her paternal uncle.    ROS:  Please see the history of present illness.   Otherwise, review of systems are positive for none.   All other systems are reviewed and negative.    PHYSICAL EXAM: VS:  BP 108/72   Pulse 66   Ht 5\' 10"  (1.778 m)   Wt 194 lb 6.4 oz (88.2 kg)   SpO2 97%   BMI 27.89 kg/m  , BMI Body mass index is 27.89 kg/m. Affect appropriate Healthy:  appears stated age 58: normal Neck supple with no adenopathy JVP normal no bruits no thyromegaly Lungs clear with no wheezing and good diaphragmatic motion Heart:  S1/S2 no murmur, no rub, gallop or click PMI normal post left mastectomy  Abdomen: benighn, BS positve, no tenderness, no AAA no bruit.  No HSM or HJR Distal pulses intact with no  bruits No edema Neuro non-focal Skin warm and dry No muscular weakness    EKG:  Have asked for copy from [facility] urgent care    Recent Labs: No results found for requested labs within last 8760 hours.    Lipid Panel No results found for: CHOL, TRIG, HDL, CHOLHDL, VLDL, LDLCALC, LDLDIRECT    Wt Readings from Last 3 Encounters:  10/17/16 194 lb 6.4 oz (88.2 kg)  07/15/13 186 lb 9.6 oz (84.6 kg)  04/29/13 194 lb 9.6 oz (88.3 kg)      Other studies Reviewed: Additional studies/ records that were reviewed today include: Notes from Dr Drema Dallas .    ASSESSMENT AND PLAN:  1.  Palpitations: benign resolved no need for monitor 2. Breast Cancer: left mastectomy 2010 with negative sentinel node biopsy. Reconstruction Dr Denese Killings Continue self exam And mammogram per primary  3. Dyspnea:  With  atypical chest pain and decreased exercise tolerance f/u exercise Echo   Current medicines are reviewed at length with the patient today.  The patient does not have concerns regarding medicines.  The following changes have been made:  no change  Labs/ tests ordered today include: Stress echo  No orders of the defined types were placed in this encounter.    Disposition:   FU with me in 3 months      Signed, Jenkins Rouge, MD  10/17/2016 4:10 PM    [facility] [place], [place], [place]  [address] Phone: [phone]; Fax: [phone]

## 2016-10-17 ENCOUNTER — Encounter: Payer: Self-pay | Admitting: Cardiovascular Disease

## 2016-10-17 ENCOUNTER — Other Ambulatory Visit: Payer: Self-pay

## 2016-10-17 ENCOUNTER — Ambulatory Visit (INDEPENDENT_AMBULATORY_CARE_PROVIDER_SITE_OTHER): Payer: Managed Care, Other (non HMO) | Admitting: Cardiovascular Disease

## 2016-10-17 ENCOUNTER — Encounter (INDEPENDENT_AMBULATORY_CARE_PROVIDER_SITE_OTHER): Payer: Self-pay

## 2016-10-17 ENCOUNTER — Other Ambulatory Visit: Payer: Self-pay | Admitting: General Surgery

## 2016-10-17 VITALS — BP 108/72 | HR 66 | Ht 70.0 in | Wt 194.4 lb

## 2016-10-17 DIAGNOSIS — R0789 Other chest pain: Secondary | ICD-10-CM | POA: Diagnosis not present

## 2016-10-17 DIAGNOSIS — Z1231 Encounter for screening mammogram for malignant neoplasm of breast: Secondary | ICD-10-CM

## 2016-10-17 DIAGNOSIS — R0602 Shortness of breath: Secondary | ICD-10-CM | POA: Diagnosis not present

## 2016-10-17 NOTE — Addendum Note (Signed)
Addended by: Aris Georgia, Werner Labella L on: 10/17/2016 05:04 PM   Modules accepted: Orders

## 2016-10-17 NOTE — Patient Instructions (Addendum)
Medication Instructions:  Your physician recommends that you continue on your current medications as directed. Please refer to the Current Medication list given to you today.  Labwork: NONE  Testing/Procedures: Your physician has requested that you have a stress echocardiogram. For further information please visit [email or web address]. Please follow instruction sheet as given.  Follow-Up: Your physician wants you to follow-up in: 3 months with Dr. Johnsie Cancel.   If you need a refill on your cardiac medications before your next appointment, please call your pharmacy.

## 2016-10-19 ENCOUNTER — Telehealth: Payer: Self-pay | Admitting: Cardiovascular Disease

## 2016-10-19 NOTE — Telephone Encounter (Signed)
Records received from [facility]. Placed in Chart Prep.

## 2016-10-28 ENCOUNTER — Telehealth (HOSPITAL_COMMUNITY): Payer: Self-pay | Admitting: *Deleted

## 2016-10-28 NOTE — Telephone Encounter (Signed)
Patient given detailed instructions per Stress Test Requisition Sheet for test on 11/02/16 at 2:30.Patient Notified to arrive 30 minutes early, and that it is imperative to arrive on time for appointment to keep from having the test rescheduled.  Patient verbalized understanding. Veronia Beets

## 2016-11-02 ENCOUNTER — Ambulatory Visit (HOSPITAL_COMMUNITY): Payer: Managed Care, Other (non HMO)

## 2016-11-02 ENCOUNTER — Ambulatory Visit (HOSPITAL_COMMUNITY): Payer: Managed Care, Other (non HMO) | Attending: Cardiovascular Disease

## 2016-11-02 ENCOUNTER — Encounter (INDEPENDENT_AMBULATORY_CARE_PROVIDER_SITE_OTHER): Payer: Self-pay

## 2016-11-02 DIAGNOSIS — R0602 Shortness of breath: Secondary | ICD-10-CM | POA: Insufficient documentation

## 2016-11-02 DIAGNOSIS — R0789 Other chest pain: Secondary | ICD-10-CM

## 2016-11-03 ENCOUNTER — Telehealth: Payer: Self-pay | Admitting: Cardiovascular Disease

## 2016-11-03 NOTE — Telephone Encounter (Signed)
Walk In pt Form-labs dropped off by pt. Placed in [place] box/Km

## 2016-11-25 ENCOUNTER — Ambulatory Visit
Admission: RE | Admit: 2016-11-25 | Discharge: 2016-11-25 | Disposition: A | Discharge status: Home / Self Care | Payer: Managed Care, Other (non HMO) | Source: Ambulatory Visit | Attending: General Surgery | Admitting: General Surgery

## 2016-11-25 DIAGNOSIS — Z1231 Encounter for screening mammogram for malignant neoplasm of breast: Secondary | ICD-10-CM

## 2017-01-17 ENCOUNTER — Ambulatory Visit: Payer: Self-pay | Admitting: Cardiovascular Disease

## 2017-07-25 ENCOUNTER — Encounter (INDEPENDENT_AMBULATORY_CARE_PROVIDER_SITE_OTHER): Payer: Self-pay

## 2017-07-25 ENCOUNTER — Encounter: Payer: Self-pay | Admitting: Cardiovascular Disease

## 2017-07-25 ENCOUNTER — Telehealth: Payer: Self-pay

## 2017-07-25 DIAGNOSIS — Z8249 Family history of ischemic heart disease and other diseases of the circulatory system: Secondary | ICD-10-CM

## 2017-07-25 NOTE — Telephone Encounter (Signed)
-----   Message from Osvaldo Shipper, [place] sent at 07/25/2017  2:13 PM EDT ----- Regarding: CT CA Score Pam   Looks Like Dr Johnsie Cancel oked for this CT CA Score I just need an order when you have time. Lori Cowan has a family HX of heart diease and hyperlipidemia.  Thank you   Erline Levine

## 2017-07-25 NOTE — Telephone Encounter (Signed)
Ordered  CT Calcium score for Dr. Johnsie Cancel to read.

## 2017-08-08 ENCOUNTER — Ambulatory Visit (INDEPENDENT_AMBULATORY_CARE_PROVIDER_SITE_OTHER)
Admission: RE | Admit: 2017-08-08 | Discharge: 2017-08-08 | Disposition: A | Discharge status: Home / Self Care | Payer: Self-pay | Source: Ambulatory Visit | Attending: Cardiovascular Disease | Admitting: Cardiovascular Disease

## 2017-08-08 DIAGNOSIS — Z8249 Family history of ischemic heart disease and other diseases of the circulatory system: Secondary | ICD-10-CM

## 2017-08-09 ENCOUNTER — Telehealth: Payer: Self-pay | Admitting: Cardiovascular Disease

## 2017-08-09 NOTE — Telephone Encounter (Signed)
Called patient and informed her that her calcium score was 0 per Dr. Johnsie Cancel. Patient verbalized understanding.

## 2017-08-09 NOTE — Telephone Encounter (Signed)
Pt returning call to nurse. Please call pt °

## 2017-10-30 ENCOUNTER — Other Ambulatory Visit: Payer: Self-pay | Admitting: General Surgery

## 2017-10-30 DIAGNOSIS — Z1231 Encounter for screening mammogram for malignant neoplasm of breast: Secondary | ICD-10-CM

## 2017-12-01 ENCOUNTER — Ambulatory Visit
Admission: RE | Admit: 2017-12-01 | Discharge: 2017-12-01 | Disposition: A | Discharge status: Home / Self Care | Payer: Managed Care, Other (non HMO) | Source: Ambulatory Visit | Attending: General Surgery | Admitting: General Surgery

## 2017-12-01 DIAGNOSIS — Z1231 Encounter for screening mammogram for malignant neoplasm of breast: Secondary | ICD-10-CM

## 2017-12-27 DIAGNOSIS — R5383 Other fatigue: Secondary | ICD-10-CM | POA: Insufficient documentation

## 2018-10-15 ENCOUNTER — Other Ambulatory Visit: Payer: Self-pay | Admitting: General Surgery

## 2018-10-15 DIAGNOSIS — Z1231 Encounter for screening mammogram for malignant neoplasm of breast: Secondary | ICD-10-CM

## 2018-12-10 ENCOUNTER — Ambulatory Visit
Admission: RE | Admit: 2018-12-10 | Discharge: 2018-12-10 | Disposition: A | Discharge status: Home / Self Care | Payer: 59 | Source: Ambulatory Visit | Attending: General Surgery | Admitting: General Surgery

## 2018-12-10 ENCOUNTER — Other Ambulatory Visit: Payer: Self-pay

## 2018-12-10 DIAGNOSIS — Z1231 Encounter for screening mammogram for malignant neoplasm of breast: Secondary | ICD-10-CM

## 2019-11-01 ENCOUNTER — Encounter: Payer: Self-pay | Admitting: Genetic Counselor

## 2019-11-13 ENCOUNTER — Other Ambulatory Visit: Payer: Self-pay | Admitting: Obstetrics & Gynecology

## 2019-11-13 DIAGNOSIS — Z1231 Encounter for screening mammogram for malignant neoplasm of breast: Secondary | ICD-10-CM

## 2019-12-11 ENCOUNTER — Other Ambulatory Visit: Payer: Self-pay

## 2019-12-11 ENCOUNTER — Inpatient Hospital Stay: Payer: 59 | Attending: Genetic Counselor | Admitting: Genetic Counselor

## 2019-12-11 ENCOUNTER — Encounter: Payer: Self-pay | Admitting: Genetic Counselor

## 2019-12-11 ENCOUNTER — Inpatient Hospital Stay: Payer: 59

## 2019-12-11 DIAGNOSIS — Z800 Family history of malignant neoplasm of digestive organs: Secondary | ICD-10-CM | POA: Insufficient documentation

## 2019-12-11 DIAGNOSIS — Z8042 Family history of malignant neoplasm of prostate: Secondary | ICD-10-CM | POA: Diagnosis not present

## 2019-12-11 DIAGNOSIS — Z808 Family history of malignant neoplasm of other organs or systems: Secondary | ICD-10-CM | POA: Insufficient documentation

## 2019-12-11 DIAGNOSIS — Z803 Family history of malignant neoplasm of breast: Secondary | ICD-10-CM | POA: Diagnosis not present

## 2019-12-11 DIAGNOSIS — Z853 Personal history of malignant neoplasm of breast: Secondary | ICD-10-CM | POA: Insufficient documentation

## 2019-12-11 NOTE — Progress Notes (Addendum)
REFERRING PROVIDER: Aldean Jewett, MD [address],  [place] [address]  PRIMARY PROVIDER:  Aldean Jewett, MD  PRIMARY REASON FOR VISIT:  1. Family history of melanoma   2. Family history of breast cancer   3. Family history of prostate cancer   4. Family history of colon cancer   5. Personal history of malignant neoplasm of breast      HISTORY OF PRESENT ILLNESS:   Lori Cowan, a 59 y.o. female, was seen for a [facility] cancer genetics consultation at the request of Dr. Volanda Napoleon due to a personal and family history of cancer.  [name] presents to clinic today to discuss the possibility of a hereditary predisposition to cancer, genetic testing, and to further clarify her future cancer risks, as well as potential cancer risks for family members.   In 2009, at the age of 53, [name] was diagnosed with invasive ductal carcinoma of the left breast.  The patient has had genetic testing in 2013 through [facility] with a breast/ovarian cancer panel and it was negative.  CANCER HISTORY:  Oncology History   No history exists.     RISK FACTORS:  Menarche was at age 48.  First live birth at age 65.  OCP use for approximately 15 years.  Ovaries intact: yes.  Hysterectomy: no.  Menopausal status: postmenopausal.  HRT use: 0 years. Colonoscopy: yes; normal. Mammogram within the last year: yes. Number of breast biopsies: 4. Up to date with pelvic exams: yes. Any excessive radiation exposure in the past: no  Past Medical History:  Diagnosis Date  . Breast cancer ([place]) 2009    at 44  . Cancer [facility])    breast - left  . Family history of breast cancer   . Family history of breast cancer   . Family history of colon cancer   . Family history of melanoma   . Family history of prostate cancer   . Fatigue   . Menopause   . Personal history of malignant neoplasm of breast     Past Surgical History:  Procedure Laterality Date  . BREAST BIOPSY Right  2004  . BREAST SURGERY     left mastectomy w/axillary dissection  . BREAST SURGERY     tram flap reconstruction  . MASTECTOMY Left 2007    Social History   Socioeconomic History  . Marital status: Married    Spouse name: Not on file  . Number of children: Not on file  . Years of education: Not on file  . Highest education level: Not on file  Occupational History  . Not on file  Tobacco Use  . Smoking status: Never Smoker  . Smokeless tobacco: Never Used  Substance and Sexual Activity  . Alcohol use: Yes    Comment: socially  . Drug use: No  . Sexual activity: Not on file  Other Topics Concern  . Not on file  Social History Narrative  . Not on file   Social Determinants of Health   Financial Resource Strain:   . Difficulty of Paying Living Expenses: Not on file  Food Insecurity:   . Worried About Charity fundraiser in the Last Year: Not on file  . Ran Out of Food in the Last Year: Not on file  Transportation Needs:   . Lack of Transportation (Medical): Not on file  . Lack of Transportation (Non-Medical): Not on file  Physical Activity:   . Days of Exercise per  Week: Not on file  . Minutes of Exercise per Session: Not on file  Stress:   . Feeling of Stress : Not on file  Social Connections:   . Frequency of Communication with Friends and Family: Not on file  . Frequency of Social Gatherings with Friends and Family: Not on file  . Attends Religious Services: Not on file  . Active Member of Clubs or Organizations: Not on file  . Attends Archivist Meetings: Not on file  . Marital Status: Not on file     FAMILY HISTORY:  We obtained a detailed, 4-generation family history.  Significant diagnoses are listed below: Family History  Problem Relation Age of Onset  . Cancer Mother        mesothelioma  . Cancer Father 80       colon  . Hypertension Father   . Prostate cancer Father 55  . Breast cancer Sister 71  . Melanoma Maternal Aunt        dx in  her 81s  . Breast cancer Paternal Aunt        dx in her 39s  . Prostate cancer Paternal Uncle        dx in his 53s    The patient has a son and daughter who are cancer free.  She has two sisters, one who had breast cancer at 11.  Her mother is deceased and her father is living.  The patient's father had colon cancer in his 73's and prostate cancer at 32. He had a brother and sister.  The brother just died of prostate cancer and the sister had breast cancer and died from complications of dementia.  The paternal grandparents are deceased.  The patient's mother had mesothelioma.  She had one sister who had melanoma in her 57's.  The maternal grandparents are deceased.  [name] is aware of previous family history of genetic testing for hereditary cancer risks. Patient's maternal ancestors are of [place] descent, and paternal ancestors are of [place] American descent. There is no reported Ashkenazi Jewish ancestry. There is no known consanguinity.  GENETIC COUNSELING ASSESSMENT: Ms. [name] is a 59 y.o. female with a personal and family history of cancer which is somewhat suggestive of a hereditary cancer syndrome and predisposition to cancer given the young ages of onset of breast cancer and the combination of cancer in the family. We, therefore, discussed and recommended the following at today's visit.   DISCUSSION: We discussed that 5 - 10% of cancer is hereditary.  The patient has tested negative for hereditary cancer syndromes in the past, however, her mother had a diagnosis of mesothelioma and her aunt had breast cancer early which can be seen in BAP1 tumor predisposition syndromes.  Additionally, RNA testing has found that about 1 in 40 tests can be affected by the use of RNA.  Most commonly due to splice site mutations, which were not identified in the patient's last test, but also through deep intronic variants. These could be identified either in the genes that were tested previously, or  additional genes that we can look at.  We discussed that testing is beneficial for several reasons including knowing how to follow individuals and understand if other family members could be at risk for cancer and allow them to undergo genetic testing.   We reviewed the characteristics, features and inheritance patterns of hereditary cancer syndromes. We also discussed genetic testing, including the appropriate family members to test, the process of testing, insurance coverage  and turn-around-time for results. We discussed the implications of a negative, positive, carrier and/or variant of uncertain significant result. We recommended Ms. Alia pursue genetic testing for the Multi-cancer gene panel+RNA. The Multi-Gene Panel offered by [facility] includes sequencing and/or deletion duplication testing of the following 85 genes: AIP, ALK, APC, ATM, AXIN2,BAP1,  BARD1, BLM, BMPR1A, BRCA1, BRCA2, BRIP1, CASR, CDC73, CDH1, CDK4, CDKN1B, CDKN1C, CDKN2A (p14ARF), CDKN2A (p16INK4a), CEBPA, CHEK2, CTNNA1, DICER1, DIS3L2, EGFR (c.2369C>T, p.Thr790Met variant only), EPCAM (Deletion/duplication testing only), FH, FLCN, GATA2, GPC3, GREM1 (Promoter region deletion/duplication testing only), HOXB13 (c.251G>A, p.Gly84Glu), HRAS, KIT, MAX, MEN1, MET, MITF (c.952G>A, p.Glu318Lys variant only), MLH1, MSH2, MSH3, MSH6, MUTYH, NBN, NF1, NF2, NTHL1, PALB2, PDGFRA, PHOX2B, PMS2, POLD1, POLE, POT1, PRKAR1A, PTCH1, PTEN, RAD50, RAD51C, RAD51D, RB1, RECQL4, RET, RNF43, RUNX1, SDHAF2, SDHA (sequence changes only), SDHB, SDHC, SDHD, SMAD4, SMARCA4, SMARCB1, SMARCE1, STK11, SUFU, TERC, TERT, TMEM127, TP53, TSC1, TSC2, VHL, WRN and WT1.    Based on Ms. Marrin's personal and family history of cancer, she meets medical criteria for genetic testing. Despite that she meets criteria, she may still have an out of pocket cost. We discussed that if her out of pocket cost for testing is over $100, the laboratory will call and confirm whether she wants  to proceed with testing.  If the out of pocket cost of testing is less than $100 she will be billed by the genetic testing laboratory.   PLAN: After considering the risks, benefits, and limitations, Ms. Sedore provided informed consent to pursue genetic testing and the blood sample was sent to [facility] for analysis of the Multi-cancer gene panel + RNA. Results should be available within approximately 2-3 weeks' time, at which point they will be disclosed by telephone to Ms. Lotspeich, as will any additional recommendations warranted by these results. Ms. Hanna will receive a summary of her genetic counseling visit and a copy of her results once available. This information will also be available in Epic.   Lastly, we encouraged Ms. Scheiderer to remain in contact with cancer genetics annually so that we can continuously update the family history and inform her of any changes in cancer genetics and testing that may be of benefit for this family.   Ms. Taff questions were answered to her satisfaction today. Our contact information was provided should additional questions or concerns arise. Thank you for the referral and allowing [place] to share in the care of your patient.   Tayvin Preslar P. Florene Glen, [place], [facility] Licensed, Insurance risk surveyor [email or web address] phone: [phone]  The patient was seen for a total of 35 minutes in face-to-face genetic counseling.  This patient was discussed with Drs. Magrinat, Lindi Adie and/or Burr Medico who agrees with the above.    _______________________________________________________________________ For Office Staff:  Number of people involved in session: 1 Was an Intern/ student involved with case: no

## 2019-12-12 ENCOUNTER — Other Ambulatory Visit: Payer: Self-pay | Admitting: Obstetrics & Gynecology

## 2019-12-12 ENCOUNTER — Ambulatory Visit
Admission: RE | Admit: 2019-12-12 | Discharge: 2019-12-12 | Disposition: A | Discharge status: Home / Self Care | Payer: 59 | Source: Ambulatory Visit | Attending: Obstetrics & Gynecology | Admitting: Obstetrics & Gynecology

## 2019-12-12 DIAGNOSIS — Z1231 Encounter for screening mammogram for malignant neoplasm of breast: Secondary | ICD-10-CM

## 2020-01-03 ENCOUNTER — Telehealth: Payer: Self-pay | Admitting: Genetic Counselor

## 2020-01-03 ENCOUNTER — Encounter: Payer: Self-pay | Admitting: Genetic Counselor

## 2020-01-03 DIAGNOSIS — Z1379 Encounter for other screening for genetic and chromosomal anomalies: Secondary | ICD-10-CM | POA: Insufficient documentation

## 2020-01-03 NOTE — Telephone Encounter (Signed)
LM on VM that results are back and to please call. 

## 2020-01-07 ENCOUNTER — Ambulatory Visit: Payer: Self-pay | Admitting: Genetic Counselor

## 2020-01-07 DIAGNOSIS — Z1379 Encounter for other screening for genetic and chromosomal anomalies: Secondary | ICD-10-CM

## 2020-01-07 NOTE — Telephone Encounter (Signed)
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  There is no medical management change based on this.  Two VUS also identified.  No medical management changes based on the VUS either.

## 2020-01-07 NOTE — Progress Notes (Signed)
HPI: Lori Cowan was previously seen in the [facility] clinic due to a family of cancer and concerns regarding a hereditary predisposition to cancer. Please refer to our prior cancer genetics clinic note for more information regarding Lori Cowan's medical, social and family histories, and our assessment and recommendations, at the time. Lori Cowan recent genetic test results were disclosed to her, as were recommendations warranted by these results. These results and recommendations are discussed in more detail below.   FAMILY HISTORY:  We obtained a detailed, 4-generation family history.  Significant diagnoses are listed below: Family History  Problem Relation Age of Onset  . Cancer Mother        mesothelioma  . Cancer Father 38       colon  . Hypertension Father   . Prostate cancer Father 34  . Breast cancer Sister 36  . Melanoma Maternal Aunt        dx in her 38s  . Breast cancer Paternal Aunt        dx in her 59s  . Prostate cancer Paternal Uncle        dx in his 71s    The patient has a son and daughter who are cancer free.  She has two sisters, one who had breast cancer at 47.  Her mother is deceased and her father is living.  The patient's father had colon cancer in his 48's and prostate cancer at 59. He had a brother and sister.  The brother just died of prostate cancer and the sister had breast cancer and died from complications of dementia.  The paternal grandparents are deceased.  The patient's mother had mesothelioma.  She had one sister who had melanoma in her 26's.  The maternal grandparents are deceased.  Lori Cowan is aware of previous family history of genetic testing for hereditary cancer risks. Patient's maternal ancestors are of [place] descent, and paternal ancestors are of [place] American descent. There is no reported Ashkenazi Jewish ancestry. There is no known consanguinity.    GENETIC TEST RESULTS: At the time of Lori Cowan's visit, we  recommended she pursue genetic testing of the Multi cancer gene panel+RNA. This test, which included sequencing and deletion/duplication analysis of the genes listed on the test report, was performed at [facility]. Lori Cowan was called today with her genetic test results. 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  Since Lori Cowan has only one pathogenic mutation in [place], she is NOT affected with NTHL1-associated polyposis, but instead is a carrier. A copy of the test report has been scanned into Epic and is located under the Molecular Pathology section of the Results Review tab.    We discussed with Lori Cowan that because current genetic testing is not perfect, it is possible there may be a different gene mutation in one of these genes that current testing cannot detect, but that chance is small.  We also discussed, that there could be another gene that has not yet been discovered, or that we have not yet tested, that is responsible for the cancer diagnoses in the family. It is also possible there is a hereditary cause for the cancer in the family that Lori Cowan did not inherit and therefore was not identified in her testing.  Therefore, it is important to remain in touch with cancer genetics in the future so that we can continue to offer Lori Cowan the most up to date genetic testing.  Genetic testing did identify two Variants of uncertain significance (VUS) - one in the DIS3L2 gene called c.301G>T (p.Ala101Ser) and a second in the VHL gene called c.3G>T (p.Met1?).  At this time, it is unknown if these variants are associated with increased cancer risk or if they are normal findings, but most variants such as these get reclassified to being inconsequential. They should not be used to make medical management decisions. With time, we suspect the lab will determine the significance of these variants, if any. If we do learn more  about them, we will try to contact Lori Cowan to discuss it further. However, it is important to stay in touch with [place] periodically and keep the address and phone number up to date.  ADDITIONAL GENETIC TESTING: We discussed with Lori Cowan that her genetic testing was fairly extensive.  If there are genes identified to increase cancer risk that can be analyzed in the future, we would be happy to discuss and coordinate this testing at that time.    CANCER SCREENING RECOMMENDATIONS:  We discussed the implications of a heterozygous NTHL1 mutation for Lori Cowan, and discussed who else in the family should have genetic testing. At this time there are not any agreed upon medical management guidelines for heterozygous NTHL1 carriers.  The remainder of Lori Cowan's test result is considered negative (normal).  This means that we have not identified a hereditary cause for her personal and family history of cancer at this time. Most cancers happen by chance and this negative test suggests that her cancer may fall into this category.    While reassuring, this does not definitively rule out a hereditary predisposition to cancer. It is still possible that there could be genetic mutations that are undetectable by current technology. There could be genetic mutations in genes that have not been tested or identified to increase cancer risk.  Therefore, it is recommended she continue to follow the cancer management and screening guidelines provided by her oncology and primary healthcare provider.   An individual's cancer risk and medical management are not determined by genetic test results alone. Overall cancer risk assessment incorporates additional factors, including personal medical history, family history, and any available genetic information that may result in a personalized plan for cancer prevention and surveillance  RECOMMENDATIONS FOR FAMILY MEMBERS:  Since we now know of the single, heterozygous NTHL1 gene  mutation in Lori Cowan, we can test at-risk relatives to determine whether or not they have inherited the mutation and are at increased risk for cancer.   Lori Cowan children and siblings have a 50% chance to have inherited this mutation. We recommend they have genetic testing for this same mutation, as identifying the presence of this mutation would allow them to also take advantage of risk-reducing measures.   Individuals in this family might be at some increased risk of developing cancer, over the general population risk, simply due to the family history of cancer.  We recommended women in this family have a yearly mammogram beginning at age 84, or 16 years younger than the earliest onset of cancer, an annual clinical breast exam, and perform monthly breast self-exams. Women in this family should also have a gynecological exam as recommended by their primary provider. All family members should be referred for colonoscopy starting at age 61.  FOLLOW-UP: Lastly, we discussed with Lori Cowan that cancer genetics is a rapidly advancing field and it is possible that new genetic tests will be appropriate for her and/or her family  members in the future. We encouraged her to remain in contact with cancer genetics on an annual basis so we can update her personal and family histories and let her know of advances in cancer genetics that may benefit this family.   Our contact number was provided. Ms. Gemme questions were answered to her satisfaction, and she knows she is welcome to call us at anytime with additional questions or concerns.   Roma Kayser, [place], [facility] Licensed, Certified Genetic Counselor [email or web address]

## 2021-06-16 ENCOUNTER — Other Ambulatory Visit: Payer: Self-pay | Admitting: Obstetrics and Gynecology

## 2021-06-16 ENCOUNTER — Other Ambulatory Visit (HOSPITAL_COMMUNITY)
Admission: RE | Admit: 2021-06-16 | Discharge: 2021-06-16 | Disposition: A | Discharge status: Home / Self Care | Payer: 59 | Source: Ambulatory Visit | Attending: Obstetrics and Gynecology | Admitting: Obstetrics and Gynecology

## 2021-06-16 DIAGNOSIS — Z1231 Encounter for screening mammogram for malignant neoplasm of breast: Secondary | ICD-10-CM

## 2021-06-16 DIAGNOSIS — Z01419 Encounter for gynecological examination (general) (routine) without abnormal findings: Secondary | ICD-10-CM | POA: Diagnosis present

## 2021-06-21 ENCOUNTER — Ambulatory Visit
Admission: RE | Admit: 2021-06-21 | Discharge: 2021-06-21 | Disposition: A | Discharge status: Home / Self Care | Payer: 59 | Source: Ambulatory Visit | Attending: Obstetrics and Gynecology | Admitting: Obstetrics and Gynecology

## 2021-06-21 DIAGNOSIS — Z1231 Encounter for screening mammogram for malignant neoplasm of breast: Secondary | ICD-10-CM

## 2021-06-21 LAB — CYTOLOGY - PAP
Comment: NEGATIVE
Comment: NEGATIVE
Comment: NEGATIVE
Diagnosis: NEGATIVE
Diagnosis: REACTIVE
HPV 16: NEGATIVE
HPV 18 / 45: NEGATIVE
High risk HPV: POSITIVE — AB

## 2022-04-29 ENCOUNTER — Other Ambulatory Visit: Payer: Self-pay | Admitting: Obstetrics and Gynecology

## 2022-04-29 DIAGNOSIS — Z1231 Encounter for screening mammogram for malignant neoplasm of breast: Secondary | ICD-10-CM

## 2022-06-20 ENCOUNTER — Other Ambulatory Visit: Payer: Self-pay | Admitting: Obstetrics and Gynecology

## 2022-06-20 ENCOUNTER — Other Ambulatory Visit (HOSPITAL_COMMUNITY)
Admission: RE | Admit: 2022-06-20 | Discharge: 2022-06-20 | Disposition: A | Discharge status: Home / Self Care | Payer: 59 | Source: Ambulatory Visit | Attending: Obstetrics and Gynecology | Admitting: Obstetrics and Gynecology

## 2022-06-20 DIAGNOSIS — Z01419 Encounter for gynecological examination (general) (routine) without abnormal findings: Secondary | ICD-10-CM | POA: Diagnosis not present

## 2022-06-22 LAB — CYTOLOGY - PAP
Comment: NEGATIVE
Comment: NEGATIVE
Comment: NEGATIVE
Diagnosis: UNDETERMINED — AB
HPV 16: NEGATIVE
HPV 18 / 45: NEGATIVE
High risk HPV: POSITIVE — AB

## 2022-06-23 ENCOUNTER — Ambulatory Visit: Payer: 59

## 2022-06-23 ENCOUNTER — Ambulatory Visit
Admission: RE | Admit: 2022-06-23 | Discharge: 2022-06-23 | Disposition: A | Discharge status: Home / Self Care | Payer: 59 | Source: Ambulatory Visit | Attending: Obstetrics and Gynecology | Admitting: Obstetrics and Gynecology

## 2022-06-23 DIAGNOSIS — Z1231 Encounter for screening mammogram for malignant neoplasm of breast: Secondary | ICD-10-CM

## 2022-08-01 ENCOUNTER — Other Ambulatory Visit: Payer: Self-pay | Admitting: Obstetrics and Gynecology

## 2023-06-12 ENCOUNTER — Other Ambulatory Visit: Payer: Self-pay | Admitting: Obstetrics and Gynecology

## 2023-06-12 DIAGNOSIS — Z1231 Encounter for screening mammogram for malignant neoplasm of breast: Secondary | ICD-10-CM

## 2023-06-29 ENCOUNTER — Ambulatory Visit
Admission: RE | Admit: 2023-06-29 | Discharge: 2023-06-29 | Disposition: A | Discharge status: Home / Self Care | Source: Ambulatory Visit | Attending: Obstetrics and Gynecology | Admitting: Obstetrics and Gynecology

## 2023-06-29 DIAGNOSIS — Z1231 Encounter for screening mammogram for malignant neoplasm of breast: Secondary | ICD-10-CM

## 2023-07-17 ENCOUNTER — Other Ambulatory Visit (HOSPITAL_COMMUNITY)
Admission: RE | Admit: 2023-07-17 | Discharge: 2023-07-17 | Disposition: A | Discharge status: Home / Self Care | Source: Ambulatory Visit | Attending: Family Medicine | Admitting: Family Medicine

## 2023-07-17 DIAGNOSIS — Z124 Encounter for screening for malignant neoplasm of cervix: Secondary | ICD-10-CM | POA: Diagnosis present

## 2023-07-24 LAB — CYTOLOGY - PAP
Comment: NEGATIVE
Comment: NEGATIVE
Comment: NEGATIVE
Diagnosis: NEGATIVE
Diagnosis: REACTIVE
HPV 16: NEGATIVE
HPV 18 / 45: NEGATIVE
High risk HPV: POSITIVE — AB

## 2023-07-25 ENCOUNTER — Encounter: Payer: Self-pay | Admitting: Family Medicine
